# Patient Record
Sex: Male | Born: 1975 | Race: Black or African American | Hispanic: No | Marital: Single | Smoking: Current some day smoker
Health system: Southern US, Community
[De-identification: ages and names within clinical notes are randomized; demographics above are authoritative.]

## PROBLEM LIST (undated history)

## (undated) DIAGNOSIS — G8929 Other chronic pain: Secondary | ICD-10-CM

## (undated) DIAGNOSIS — R079 Chest pain, unspecified: Secondary | ICD-10-CM

## (undated) DIAGNOSIS — F319 Bipolar disorder, unspecified: Secondary | ICD-10-CM

## (undated) DIAGNOSIS — M5430 Sciatica, unspecified side: Secondary | ICD-10-CM

## (undated) DIAGNOSIS — I10 Essential (primary) hypertension: Secondary | ICD-10-CM

## (undated) DIAGNOSIS — G43909 Migraine, unspecified, not intractable, without status migrainosus: Secondary | ICD-10-CM

## (undated) DIAGNOSIS — Z720 Tobacco use: Secondary | ICD-10-CM

## (undated) DIAGNOSIS — R55 Syncope and collapse: Secondary | ICD-10-CM

## (undated) DIAGNOSIS — M549 Dorsalgia, unspecified: Secondary | ICD-10-CM

## (undated) HISTORY — DX: Syncope and collapse: R55

## (undated) HISTORY — DX: Essential (primary) hypertension: I10

## (undated) HISTORY — DX: Bipolar disorder, unspecified: F31.9

## (undated) HISTORY — DX: Tobacco use: Z72.0

## (undated) HISTORY — PX: OTHER SURGICAL HISTORY: SHX169

## (undated) HISTORY — DX: Chest pain, unspecified: R07.9

---

## 2002-07-06 ENCOUNTER — Emergency Department (HOSPITAL_COMMUNITY): Admission: EM | Admit: 2002-07-06 | Discharge: 2002-07-06 | Payer: Self-pay | Admitting: Internal Medicine

## 2002-07-06 ENCOUNTER — Encounter: Payer: Self-pay | Admitting: Internal Medicine

## 2004-06-17 ENCOUNTER — Emergency Department (HOSPITAL_COMMUNITY): Admission: EM | Admit: 2004-06-17 | Discharge: 2004-06-17 | Payer: Self-pay | Admitting: Emergency Medicine

## 2005-04-29 ENCOUNTER — Emergency Department (HOSPITAL_COMMUNITY): Admission: EM | Admit: 2005-04-29 | Discharge: 2005-04-29 | Payer: Self-pay | Admitting: Emergency Medicine

## 2007-11-19 ENCOUNTER — Emergency Department: Payer: Self-pay | Admitting: Emergency Medicine

## 2008-01-22 ENCOUNTER — Emergency Department (HOSPITAL_COMMUNITY): Admission: EM | Admit: 2008-01-22 | Discharge: 2008-01-22 | Payer: Self-pay | Admitting: Emergency Medicine

## 2008-05-21 ENCOUNTER — Emergency Department (HOSPITAL_COMMUNITY): Admission: EM | Admit: 2008-05-21 | Discharge: 2008-05-21 | Payer: Self-pay | Admitting: Emergency Medicine

## 2012-01-05 ENCOUNTER — Encounter (HOSPITAL_COMMUNITY): Payer: Self-pay | Admitting: Physical Medicine and Rehabilitation

## 2012-01-05 ENCOUNTER — Emergency Department (HOSPITAL_COMMUNITY)
Admission: EM | Admit: 2012-01-05 | Discharge: 2012-01-05 | Disposition: A | Discharge status: Home / Self Care | Payer: Self-pay | Attending: Emergency Medicine | Admitting: Emergency Medicine

## 2012-01-05 ENCOUNTER — Emergency Department (HOSPITAL_COMMUNITY): Payer: Self-pay

## 2012-01-05 DIAGNOSIS — S93409A Sprain of unspecified ligament of unspecified ankle, initial encounter: Secondary | ICD-10-CM | POA: Insufficient documentation

## 2012-01-05 DIAGNOSIS — X500XXA Overexertion from strenuous movement or load, initial encounter: Secondary | ICD-10-CM | POA: Insufficient documentation

## 2012-01-05 MED ORDER — IBUPROFEN 800 MG PO TABS
ORAL_TABLET | ORAL | Status: AC
  Filled 2012-01-05: qty 1

## 2012-01-05 MED ORDER — IBUPROFEN 800 MG PO TABS
800.0000 mg | ORAL_TABLET | Freq: Once | ORAL | Status: AC
  Administered 2012-01-05: 800 mg via ORAL

## 2012-01-05 NOTE — ED Notes (Signed)
Pt transported to xray 

## 2012-01-05 NOTE — ED Notes (Signed)
Pt returned to exam room. 

## 2012-01-05 NOTE — ED Provider Notes (Signed)
History     CSN: [id number]  Arrival date & time 01/05/12  4696   First MD Initiated Contact with Patient 01/05/12 [phone]      Chief Complaint  Patient presents with  . Ankle Pain    (Consider location/radiation/quality/duration/timing/severity/associated sxs/prior treatment) HPI  Patient presents to emergency department complaining of right ankle injury. Patient states he was running and felt his right ankle invert last night causing pain. Patient states that since onset pain as been gradually worsening with gradual worsening swelling. Patient states pain and swelling is located on the lateral aspect of his ankle. Patient states he's taken Tylenol for pain with mild relief. Patient states he woke this morning with ongoing pain and ongoing inability to bear weight and therefore presents to emergency department for evaluation. Patient states he has no known medical problems and takes no medicines on a regular basis. He has not seen an orthopedic specialist in [place] [facility] area in the past. Patient states pain is aggravated by movement and weight-bearing and improved with rest. He denies numbness or tingling and larger degree denies additional injury.  History reviewed. No pertinent past medical history.  History reviewed. No pertinent past surgical history.  History reviewed. No pertinent family history.  History  Substance Use Topics  . Smoking status: Current Some Day Smoker    Types: Cigarettes  . Smokeless tobacco: Not on file  . Alcohol Use: No      Review of Systems  Musculoskeletal: Positive for joint swelling. Negative for back pain.  Neurological: Negative for weakness and numbness.    Allergies  Review of patient's allergies indicates no known allergies.  Home Medications  No current outpatient prescriptions on file.  BP 131/91  Pulse 86  Temp(Src) 98.2 F (36.8 C) (Oral)  Resp 20  SpO2 96%  Physical Exam  Nursing note and vitals reviewed. Constitutional:  He is oriented to person, place, and time. He appears well-developed.  HENT:  Head: Normocephalic and atraumatic.  Eyes: Conjunctivae are normal.  Neck: Normal range of motion. Neck supple.  Cardiovascular: Normal rate.   Pulmonary/Chest: Effort normal.  Musculoskeletal: Normal range of motion. He exhibits edema and tenderness.       TTP of lateral right ankle with soft tissue swelling but no bruising. Decreased ROM due to pain but good cap refill, pedal pulse and wiggling toes well. No TTP of entire forefoot.   Neurological: He is alert and oriented to person, place, and time.  Skin: Skin is warm and dry. No rash noted. No erythema. No pallor.    ED Course  Procedures (including critical care time)  PO ibuprofen  Labs Reviewed - No data to display Dg Ankle Complete Right  01/05/2012  *RADIOLOGY REPORT*  Clinical Data: Fall, ankle injury, pain.  RIGHT ANKLE - COMPLETE 3+ VIEW  Comparison: None.  Findings: No acute bony abnormality.  Specifically, no fracture, subluxation, or dislocation.  Soft tissues are intact.  IMPRESSION: No acute bony abnormality.  Original Report Authenticated By: Cyndie Chime, M.D.     1. Ankle sprain       MDM  Swelling and TTP of right lateral ankle but no TTP or swelling of fore foot or calf. No break in skin. Good pedal pulse and cap refill of all toes. Wiggling toes without difficulty.  No acute findings on xray         Lenon Oms [place], [place] 01/05/12 [phone]

## 2012-01-05 NOTE — Discharge Instructions (Signed)
Wear ankle brace for at least 2 weeks for stabilization of ankle. Use crutches as needed for comfort. Ice and elevate ankle throughout the day. Alternate between ibuprofen and Tylenol for pain relief. You make take 800mg  of ibuprofen, three times a day with food on your stomach for maximum dose for the next 5 days. Call orthopedic follow up today today or tomorrow to schedule followup appointment for recheck of ongoing ankle pain that can be canceled with a 24-48 hour notice if complete resolution of pain.   Ankle Sprain An ankle sprain is an injury to the strong, fibrous tissues (ligaments) that hold the bones of your ankle joint together.  CAUSES Ankle sprain usually is caused by a fall or by twisting your ankle. People who participate in sports are more prone to these types of injuries.  SYMPTOMS  Symptoms of ankle sprain include:  Pain in your ankle. The pain may be present at rest or only when you are trying to stand or walk.   Swelling.   Bruising. Bruising may develop immediately or within 1 to 2 days after your injury.   Difficulty standing or walking.  DIAGNOSIS  Your caregiver will ask you details about your injury and perform a physical exam of your ankle to determine if you have an ankle sprain. During the physical exam, your caregiver will press and squeeze specific areas of your foot and ankle. Your caregiver will try to move your ankle in certain ways. An X-ray exam may be done to be sure a bone was not broken or a ligament did not separate from one of the bones in your ankle (avulsion).  TREATMENT  Certain types of braces can help stabilize your ankle. Your caregiver can make a recommendation for this. Your caregiver may recommend the use of medication for pain. If your sprain is severe, your caregiver may refer you to a surgeon who helps to restore function to parts of your skeletal system (orthopedist) or a physical therapist. HOME CARE INSTRUCTIONS  Apply ice to your injury  for 1 to 2 days or as directed by your caregiver. Applying ice helps to reduce inflammation and pain.  Put ice in a plastic bag.   Place a towel between your skin and the bag.   Leave the ice on for 15 to 20 minutes at a time, every 2 hours while you are awake.   Take over-the-counter or prescription medicines for pain, discomfort, or fever only as directed by your caregiver.   Keep your injured leg elevated, when possible, to lessen swelling.   If your caregiver recommends crutches, use them as instructed. Gradually, put weight on the affected ankle. Continue to use crutches or a cane until you can walk without feeling pain in your ankle.   If you have a plaster splint, wear the splint as directed by your caregiver. Do not rest it on anything harder than a pillow the first 24 hours. Do not put weight on it. Do not get it wet. You may take it off to take a shower or bath.   You may have been given an elastic bandage to wear around your ankle to provide support. If the elastic bandage is too tight (you have numbness or tingling in your foot or your foot becomes cold and blue), adjust the bandage to make it comfortable.   If you have an air splint, you may blow more air into it or let air out to make it more comfortable. You may take your  splint off at night and before taking a shower or bath.   Wiggle your toes in the splint several times per day if you are able.  SEEK MEDICAL CARE IF:   You have an increase in bruising, swelling, or pain.   Your toes feel cold.   Pain relief is not achieved with medication.  SEEK IMMEDIATE MEDICAL CARE IF: Your toes are numb or blue or you have severe pain. MAKE SURE YOU:   Understand these instructions.   Will watch your condition.   Will get help right away if you are not doing well or get worse.  Document Released: 09/06/2005 Document Revised: 08/26/2011 Document Reviewed: 04/10/2008 [facility] Patient Information 2012 [place], [place].

## 2012-01-05 NOTE — ED Notes (Signed)
Pt presents to department for evaluation of R ankle pain. States he injured ankle playing basketball last night. Now states pain and swelling. 7/10 pain upon arrival to ED. Ambulatory to triage. No signs of distress noted at the time.

## 2012-01-05 NOTE — Progress Notes (Signed)
Orthopedic Tech Progress Note Patient Details:  Maxwell White 02-28-1976 [id number]  Other Ortho Devices Type of Ortho Device: ASO;Crutches Ortho Device Location: right ankle Ortho Device Interventions: Application   Crickett Abbett T 01/05/2012, 9:54 AM

## 2012-01-06 NOTE — ED Provider Notes (Signed)
Medical screening examination/treatment/procedure(s) were performed by non-physician practitioner and as supervising physician I was immediately available for consultation/collaboration.   Joya Gaskins, MD 01/06/12 [phone]

## 2012-08-02 ENCOUNTER — Emergency Department (HOSPITAL_COMMUNITY)
Admission: EM | Admit: 2012-08-02 | Discharge: 2012-08-02 | Disposition: A | Discharge status: Home / Self Care | Payer: Self-pay | Attending: Emergency Medicine | Admitting: Emergency Medicine

## 2012-08-02 ENCOUNTER — Encounter (HOSPITAL_COMMUNITY): Payer: Self-pay | Admitting: *Deleted

## 2012-08-02 DIAGNOSIS — F17200 Nicotine dependence, unspecified, uncomplicated: Secondary | ICD-10-CM | POA: Insufficient documentation

## 2012-08-02 DIAGNOSIS — R109 Unspecified abdominal pain: Secondary | ICD-10-CM | POA: Insufficient documentation

## 2012-08-02 LAB — CBC WITH DIFFERENTIAL/PLATELET
Basophils Absolute: 0 10*3/uL (ref 0.0–0.1)
Basophils Relative: 0 % (ref 0–1)
HCT: 43.5 % (ref 39.0–52.0)
MCHC: 34.5 g/dL (ref 30.0–36.0)
Monocytes Absolute: 0.5 10*3/uL (ref 0.1–1.0)
Neutro Abs: 5.8 10*3/uL (ref 1.7–7.7)
Neutrophils Relative %: 64 % (ref 43–77)
Platelets: 240 10*3/uL (ref 150–400)
RDW: 12.9 % (ref 11.5–15.5)
WBC: 9.1 10*3/uL (ref 4.0–10.5)

## 2012-08-02 LAB — URINALYSIS, ROUTINE W REFLEX MICROSCOPIC
Bilirubin Urine: NEGATIVE
Glucose, UA: NEGATIVE mg/dL
Hgb urine dipstick: NEGATIVE
Protein, ur: NEGATIVE mg/dL
Specific Gravity, Urine: 1.02 (ref 1.005–1.030)

## 2012-08-02 LAB — COMPREHENSIVE METABOLIC PANEL
ALT: 15 U/L (ref 0–53)
AST: 14 U/L (ref 0–37)
Albumin: 3.8 g/dL (ref 3.5–5.2)
Chloride: 104 mEq/L (ref 96–112)
Creatinine, Ser: 1.06 mg/dL (ref 0.50–1.35)
Sodium: 141 mEq/L (ref 135–145)
Total Bilirubin: 0.6 mg/dL (ref 0.3–1.2)

## 2012-08-02 MED ORDER — IBUPROFEN 800 MG PO TABS: 800.0000 mg | ORAL_TABLET | Freq: Three times a day (TID) | ORAL | Status: DC | PRN

## 2012-08-02 NOTE — ED Provider Notes (Signed)
History     CSN: [id number]  Arrival date & time 08/02/12  1510   First MD Initiated Contact with Patient 08/02/12 1526      Chief Complaint  Patient presents with  . Flank Pain    (Consider location/radiation/quality/duration/timing/severity/associated sxs/prior treatment) Patient is a 36 y.o. male presenting with flank pain. The history is provided by the patient (the pt complains of left flank pain for a few weeks). No language interpreter was used.  Flank Pain This is a new problem. The current episode started more than 2 days ago. The problem occurs rarely. The problem has not changed since onset.Pertinent negatives include no chest pain, no abdominal pain and no headaches. Nothing aggravates the symptoms. Nothing relieves the symptoms. He has tried nothing for the symptoms. The treatment provided no relief.    History reviewed. No pertinent past medical history.  History reviewed. No pertinent past surgical history.  History reviewed. No pertinent family history.  History  Substance Use Topics  . Smoking status: Current Some Day Smoker    Types: Cigarettes  . Smokeless tobacco: Not on file  . Alcohol Use: Yes     Comment: occ      Review of Systems  Constitutional: Negative for fatigue.  HENT: Negative for congestion, sinus pressure and ear discharge.   Eyes: Negative for discharge.  Respiratory: Negative for cough.   Cardiovascular: Negative for chest pain.  Gastrointestinal: Negative for abdominal pain and diarrhea.  Genitourinary: Positive for flank pain. Negative for frequency and hematuria.  Musculoskeletal: Negative for back pain.  Skin: Negative for rash.  Neurological: Negative for seizures and headaches.  Hematological: Negative.   Psychiatric/Behavioral: Negative for hallucinations.    Allergies  Review of patient's allergies indicates no known allergies.  Home Medications   Current Outpatient Rx  Name  Route  Sig  Dispense  Refill  .  IBUPROFEN 800 MG PO TABS   Oral   Take 1 tablet (800 mg total) by mouth every 8 (eight) hours as needed for pain.   21 tablet   0     BP 133/91  Pulse 70  Temp 98.3 F (36.8 C) (Oral)  Resp 20  Ht 6\' 3"  (1.905 m)  Wt 250 lb (113.399 kg)  BMI 31.25 kg/m2  SpO2 100%  Physical Exam  Constitutional: He is oriented to person, place, and time. He appears well-developed.  HENT:  Head: Normocephalic and atraumatic.  Eyes: Conjunctivae normal and EOM are normal. No scleral icterus.  Neck: Neck supple. No thyromegaly present.  Cardiovascular: Normal rate and regular rhythm.  Exam reveals no gallop and no friction rub.   No murmur heard. Pulmonary/Chest: No stridor. He has no wheezes. He has no rales. He exhibits no tenderness.  Abdominal: He exhibits no distension. There is no tenderness. There is no rebound.  Genitourinary:       Mild left flank tenderness  Musculoskeletal: Normal range of motion. He exhibits no edema.  Lymphadenopathy:    He has no cervical adenopathy.  Neurological: He is oriented to person, place, and time. Coordination normal.  Skin: No rash noted. No erythema.  Psychiatric: He has a normal mood and affect. His behavior is normal.    ED Course  Procedures (including critical care time)  Labs Reviewed  COMPREHENSIVE METABOLIC PANEL - Abnormal; Notable for the following:    Glucose, Bld 109 (*)     All other components within normal limits  CBC WITH DIFFERENTIAL  URINALYSIS, ROUTINE W REFLEX MICROSCOPIC  No results found.   1. Flank pain       MDM          Benny Lennert, MD 08/02/12 1734

## 2012-08-02 NOTE — ED Notes (Signed)
Pain lt flank x 2 weeks, urine darker than usual. Dysuria at times.  No fever,

## 2013-04-06 ENCOUNTER — Encounter (HOSPITAL_COMMUNITY): Payer: Self-pay | Admitting: *Deleted

## 2013-04-06 ENCOUNTER — Emergency Department (HOSPITAL_COMMUNITY)
Admission: EM | Admit: 2013-04-06 | Discharge: 2013-04-06 | Disposition: A | Discharge status: Home / Self Care | Payer: Self-pay | Attending: Emergency Medicine | Admitting: Emergency Medicine

## 2013-04-06 ENCOUNTER — Emergency Department (HOSPITAL_COMMUNITY): Payer: Self-pay

## 2013-04-06 DIAGNOSIS — M79609 Pain in unspecified limb: Secondary | ICD-10-CM | POA: Insufficient documentation

## 2013-04-06 DIAGNOSIS — M5430 Sciatica, unspecified side: Secondary | ICD-10-CM | POA: Insufficient documentation

## 2013-04-06 DIAGNOSIS — G8929 Other chronic pain: Secondary | ICD-10-CM | POA: Insufficient documentation

## 2013-04-06 DIAGNOSIS — F17200 Nicotine dependence, unspecified, uncomplicated: Secondary | ICD-10-CM | POA: Insufficient documentation

## 2013-04-06 DIAGNOSIS — M549 Dorsalgia, unspecified: Secondary | ICD-10-CM | POA: Insufficient documentation

## 2013-04-06 HISTORY — DX: Sciatica, unspecified side: M54.30

## 2013-04-06 HISTORY — DX: Other chronic pain: G89.29

## 2013-04-06 HISTORY — DX: Dorsalgia, unspecified: M54.9

## 2013-04-06 MED ORDER — METHOCARBAMOL 500 MG PO TABS: 1000.0000 mg | ORAL_TABLET | Freq: Four times a day (QID) | ORAL | Status: DC | PRN

## 2013-04-06 MED ORDER — HYDROCODONE-ACETAMINOPHEN 5-325 MG PO TABS: ORAL_TABLET | ORAL | Status: DC

## 2013-04-06 MED ORDER — NAPROXEN 250 MG PO TABS: 250.0000 mg | ORAL_TABLET | Freq: Two times a day (BID) | ORAL | Status: DC

## 2013-04-06 MED ORDER — ONDANSETRON HCL 4 MG PO TABS: 4.0000 mg | ORAL_TABLET | Freq: Three times a day (TID) | ORAL | Status: DC | PRN

## 2013-04-06 NOTE — ED Notes (Signed)
Chronic L lumbar pain and sciatic pain for 2 weeks after riding water slide. L leg now more numb from calf upward.

## 2013-04-06 NOTE — ED Provider Notes (Signed)
History    CSN: [id number] Arrival date & time 04/06/13  1233  First MD Initiated Contact with Patient 04/06/13 1338     Chief Complaint  Patient presents with  . Back Pain  . Leg Pain    HPI Pt was seen at 1350.  Per pt, c/o gradual onset and persistence of constant acute flair of his chronic low back "pain" for the past 2 weeks. States the pain began after he was "playing at a water park" and "going down a water slide a lot." States he was "jostled around" and "thinks that's what aggravated it.  Denies any change in his usual chronic pain pattern, which radiates down his left leg.  Pain worsens with palpation of the area and body position changes. Denies incont/retention of bowel or bladder, no saddle anesthesia, no focal motor weakness, no tingling/numbness in extremities, no fevers, no injury, no abd pain.   The symptoms have been associated with no other complaints. The patient has a significant history of similar symptoms previously.     Past Medical History  Diagnosis Date  . Sciatic pain   . Chronic back pain    History reviewed. No pertinent past surgical history.  History  Substance Use Topics  . Smoking status: Current Some Day Smoker    Types: Cigarettes  . Smokeless tobacco: Not on file  . Alcohol Use: Yes     Comment: occ    Review of Systems ROS: Statement: All systems negative except as marked or noted in the HPI; Constitutional: Negative for fever and chills. ; ; Eyes: Negative for eye pain, redness and discharge. ; ; ENMT: Negative for ear pain, hoarseness, nasal congestion, sinus pressure and sore throat. ; ; Cardiovascular: Negative for chest pain, palpitations, diaphoresis, dyspnea and peripheral edema. ; ; Respiratory: Negative for cough, wheezing and stridor. ; ; Gastrointestinal: Negative for nausea, vomiting, diarrhea, abdominal pain, blood in stool, hematemesis, jaundice and rectal bleeding. . ; ; Genitourinary: Negative for dysuria, flank pain and  hematuria. ; ; Musculoskeletal: +LBP. Negative for neck pain. Negative for swelling and trauma.; ; Skin: Negative for pruritus, rash, abrasions, blisters, bruising and skin lesion.; ; Neuro: Negative for headache, lightheadedness and neck stiffness. Negative for weakness, altered level of consciousness , altered mental status, extremity weakness, paresthesias, involuntary movement, seizure and syncope.     Allergies  Review of patient's allergies indicates no known allergies.  Home Medications  No current outpatient prescriptions on file. BP 135/111  Pulse 60  Temp(Src) 98.3 F (36.8 C) (Oral)  Resp 16  Ht 6\' 3"  (1.905 m)  Wt 247 lb (112.038 kg)  BMI 30.87 kg/m2  SpO2 100% Physical Exam 1355: Physical examination:  Nursing notes reviewed; Vital signs and O2 SAT reviewed;  Constitutional: Well developed, Well nourished, Well hydrated, In no acute distress; Head:  Normocephalic, atraumatic; Eyes: EOMI, PERRL, No scleral icterus; ENMT: Mouth and pharynx normal, Mucous membranes moist; Neck: Supple, Full range of motion, No lymphadenopathy; Cardiovascular: Regular rate and rhythm, No murmur, rub, or gallop; Respiratory: Breath sounds clear & equal bilaterally, No rales, rhonchi, wheezes.  Speaking full sentences with ease, Normal respiratory effort/excursion; Chest: Nontender, Movement normal; Abdomen: Soft, Nontender, Nondistended, Normal bowel sounds; Genitourinary: No CVA tenderness; Spine:  No midline CS, TS, LS tenderness.  +TTP left lumbar paraspinal muscles;; Extremities: Pulses normal, No tenderness, No edema, No calf edema or asymmetry.; Neuro: AA&Ox3, Major CN grossly intact.  Speech clear. Climbs on and off stretcher to bedside chair by himself  without difficulty. Gait steady. Strength 5/5 equal bilat UE's and LE's, including great toe dorsiflexion.  DTR 2/4 equal bilat UE's and LE's.  No gross sensory deficits.  Neg straight leg raises bilat..; Skin: Color normal, Warm, Dry.    ED  Course  Procedures     MDM  MDM Reviewed: previous chart, nursing note and vitals Interpretation: x-ray   Dg Lumbar Spine Complete 04/06/2013   *RADIOLOGY REPORT*  Clinical Data: Low back pain and left leg pain.  LUMBAR SPINE - COMPLETE 4+ VIEW  Comparison: None.  Findings: Mild osteophytes are present at the L4-5 and L5-S1 levels.  There is also mild disc space narrowing at L5-S1. There is minimal retrolisthesis of L5 on S1.  No evidence of acute fracture or bony lesion.  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   Original Report Authenticated By: Irish Lack, M.D.      1430:  No acute fx. No red flags on neuro exam. Will tx symptomatically at this time. Dx and testing d/w pt.  Questions answered.  Verb understanding, agreeable to d/c home with outpt f/u.   Laray Anger, DO 04/09/13 [phone]

## 2013-04-06 NOTE — ED Notes (Signed)
Pt c/o lower back pain which radiates down left leg. Pt has hx of sciatica and states these symptoms are similar with past episodes. Pt states home meds are not helping the pain.

## 2013-07-25 ENCOUNTER — Encounter (HOSPITAL_COMMUNITY): Payer: Self-pay | Admitting: Emergency Medicine

## 2013-07-25 ENCOUNTER — Emergency Department (HOSPITAL_COMMUNITY)
Admission: EM | Admit: 2013-07-25 | Discharge: 2013-07-25 | Disposition: A | Discharge status: Home / Self Care | Payer: 59 | Attending: Emergency Medicine | Admitting: Emergency Medicine

## 2013-07-25 DIAGNOSIS — F17200 Nicotine dependence, unspecified, uncomplicated: Secondary | ICD-10-CM | POA: Insufficient documentation

## 2013-07-25 DIAGNOSIS — G8929 Other chronic pain: Secondary | ICD-10-CM | POA: Insufficient documentation

## 2013-07-25 DIAGNOSIS — Z79899 Other long term (current) drug therapy: Secondary | ICD-10-CM | POA: Insufficient documentation

## 2013-07-25 DIAGNOSIS — G43909 Migraine, unspecified, not intractable, without status migrainosus: Secondary | ICD-10-CM | POA: Insufficient documentation

## 2013-07-25 HISTORY — DX: Migraine, unspecified, not intractable, without status migrainosus: G43.909

## 2013-07-25 MED ORDER — SUMATRIPTAN SUCCINATE 50 MG PO TABS: 50.0000 mg | ORAL_TABLET | ORAL | Status: DC | PRN

## 2013-07-25 MED ORDER — DIPHENHYDRAMINE HCL 50 MG/ML IJ SOLN
50.0000 mg | Freq: Once | INTRAMUSCULAR | Status: AC
  Administered 2013-07-25: 50 mg via INTRAVENOUS
  Filled 2013-07-25: qty 1

## 2013-07-25 MED ORDER — PROCHLORPERAZINE EDISYLATE 5 MG/ML IJ SOLN
10.0000 mg | Freq: Once | INTRAMUSCULAR | Status: AC
  Administered 2013-07-25: 10 mg via INTRAVENOUS
  Filled 2013-07-25: qty 2

## 2013-07-25 MED ORDER — DEXAMETHASONE SODIUM PHOSPHATE 10 MG/ML IJ SOLN
10.0000 mg | Freq: Once | INTRAMUSCULAR | Status: AC
  Administered 2013-07-25: 10 mg via INTRAVENOUS
  Filled 2013-07-25: qty 1

## 2013-07-25 NOTE — ED Provider Notes (Signed)
Medical screening examination/treatment/procedure(s) were performed by non-physician practitioner and as supervising physician I was immediately available for consultation/collaboration.    Vida Roller, MD 07/25/13 [phone]

## 2013-07-25 NOTE — ED Notes (Signed)
Pt c/o headache intermittently x 2 weeks.  

## 2013-07-25 NOTE — ED Notes (Signed)
PA at bedside to disscuss disposition

## 2013-07-25 NOTE — ED Provider Notes (Signed)
CSN: [id number]     Arrival date & time 07/25/13  0008 History   First MD Initiated Contact with Patient 07/25/13 0013     Chief Complaint  Patient presents with  . Headache   (Consider location/radiation/quality/duration/timing/severity/associated sxs/prior Treatment) HPI Comments: Maxwell Lizarraga Riesgo is a 37 y.o. Male with an approximate 15 year history of chronic intermittent migraine headache ([place], presenting tonight with posterior headache which woke him from sleep tonight.  He has had increased headaches over the past 2 weeks,  Which is states is a typical pattern, as he will have headache free intervals than have multiple back to back headaches.  His headache is associated with mild photophobia,  Sometimes will have scotoma described as dark spots in his field of vision, but not at present.  He denies focal weakness, fevers, dizziness, speech difficulty, visual changes,  confusion, neck pain or stiffness.  The patient has taken ibuprofen  without relief of symptoms.  He used to be prescribed imitrex but has not taken this in several years.      The history is provided by the patient.    Past Medical History  Diagnosis Date  . Sciatic pain   . Chronic back pain   . Migraine    History reviewed. No pertinent past surgical history. History reviewed. No pertinent family history. History  Substance Use Topics  . Smoking status: Current Some Day Smoker    Types: Cigarettes  . Smokeless tobacco: Not on file  . Alcohol Use: Yes     Comment: occ    Review of Systems  Constitutional: Negative for fever.  HENT: Negative for congestion and sore throat.   Eyes: Negative.   Respiratory: Negative for chest tightness and shortness of breath.   Cardiovascular: Negative for chest pain.  Gastrointestinal: Negative for nausea and abdominal pain.  Genitourinary: Negative.   Musculoskeletal: Negative for arthralgias, joint swelling and neck pain.   Skin: Negative.  Negative for rash and wound.  Neurological: Positive for headaches. Negative for dizziness, speech difficulty, weakness, light-headedness and numbness.  Psychiatric/Behavioral: Negative.     Allergies  Review of patient's allergies indicates no known allergies.  Home Medications   Current Outpatient Rx  Name  Route  Sig  Dispense  Refill  . HYDROcodone-acetaminophen (NORCO/VICODIN) 5-325 MG per tablet      1 or 2 tabs PO q6 hours prn pain   20 tablet   0   . methocarbamol (ROBAXIN) 500 MG tablet   Oral   Take 2 tablets (1,000 mg total) by mouth 4 (four) times daily as needed (muscle spasm/pain).   25 tablet   0   . naproxen (NAPROSYN) 250 MG tablet   Oral   Take 1 tablet (250 mg total) by mouth 2 (two) times daily with a meal.   14 tablet   0   . ondansetron (ZOFRAN) 4 MG tablet   Oral   Take 1 tablet (4 mg total) by mouth every 8 (eight) hours as needed for nausea.   6 tablet   0   . SUMAtriptan (IMITREX) 50 MG tablet   Oral   Take 1 tablet (50 mg total) by mouth every 2 (two) hours as needed for migraine or headache. May repeat in 2 hours if headache persists or recurs.   10 tablet   0    BP 118/73  Pulse 53  Temp(Src) 98.3 F (36.8 C)  Resp 16  Ht 6\' 3"  (  1.905 m)  Wt 240 lb (108.863 kg)  BMI 30.00 kg/m2  SpO2 98% Physical Exam  Nursing note and vitals reviewed. Constitutional: He is oriented to person, place, and time. He appears well-developed and well-nourished. No distress.  HENT:  Head: Normocephalic and atraumatic.  Mouth/Throat: Oropharynx is clear and moist.  Eyes: EOM are normal. Pupils are equal, round, and reactive to light.  Neck: Normal range of motion. Neck supple.  Cardiovascular: Normal rate and normal heart sounds.   Pulmonary/Chest: Effort normal.  Abdominal: Soft. There is no tenderness.  Musculoskeletal: Normal range of motion.  Lymphadenopathy:    He has no cervical adenopathy.  Neurological: He is alert and  oriented to person, place, and time. He has normal strength. No sensory deficit. Gait normal. GCS eye subscore is 4. GCS verbal subscore is 5. GCS motor subscore is 6.  Normal heel-shin, normal rapid alternating movements. Cranial nerves III-XII intact.  No pronator drift.  Skin: Skin is warm and dry. No rash noted.  Psychiatric: He has a normal mood and affect. His speech is normal and behavior is normal. Thought content normal. Cognition and memory are normal.    ED Course  Procedures (including critical care time) Labs Review Labs Reviewed - No data to display Imaging Review No results found.  EKG Interpretation   None     Pt was given IV meds including decadron 10 mg, compazine 10 mg and benadryl 50 mg.   MDM   1. Migraine headache    Headache resolved after receiving compazine 10 mg, benadryl 50 mg , decadron 10 mg IV.    No neuro deficit on exam or by history to suggest emergent or surgical presentation.  Pt stable at dc.         Burgess Amor, PA-C 07/25/13 [phone]

## 2014-12-09 ENCOUNTER — Encounter (HOSPITAL_COMMUNITY): Payer: Self-pay | Admitting: *Deleted

## 2014-12-09 ENCOUNTER — Emergency Department (HOSPITAL_COMMUNITY)
Admission: EM | Admit: 2014-12-09 | Discharge: 2014-12-09 | Disposition: A | Discharge status: Home / Self Care | Payer: 59 | Attending: Emergency Medicine | Admitting: Emergency Medicine

## 2014-12-09 DIAGNOSIS — G8929 Other chronic pain: Secondary | ICD-10-CM | POA: Insufficient documentation

## 2014-12-09 DIAGNOSIS — G43909 Migraine, unspecified, not intractable, without status migrainosus: Secondary | ICD-10-CM | POA: Diagnosis not present

## 2014-12-09 DIAGNOSIS — Z791 Long term (current) use of non-steroidal anti-inflammatories (NSAID): Secondary | ICD-10-CM | POA: Insufficient documentation

## 2014-12-09 DIAGNOSIS — K649 Unspecified hemorrhoids: Secondary | ICD-10-CM | POA: Diagnosis not present

## 2014-12-09 DIAGNOSIS — K6289 Other specified diseases of anus and rectum: Secondary | ICD-10-CM | POA: Diagnosis present

## 2014-12-09 DIAGNOSIS — Z720 Tobacco use: Secondary | ICD-10-CM | POA: Diagnosis not present

## 2014-12-09 DIAGNOSIS — M5430 Sciatica, unspecified side: Secondary | ICD-10-CM | POA: Insufficient documentation

## 2014-12-09 NOTE — ED Provider Notes (Signed)
CSN: [id number]     Arrival date & time 12/09/14  2037 History  This chart was scribed for a non-physician practitioner, Noland Fordyce, PA-C working with Malvin Johns, MD by Martinique Peace, ED Scribe. The patient was seen in TR05C/TR05C. The patient's care was started at 9:12 PM.    Chief Complaint  Patient presents with  . Rectal Pain      The history is provided by the patient. No language interpreter was used.  HPI Comments: Maxwell White is a 39 y.o. male who presents to the Emergency Department complaining of chronic rectal pain and rectal bleeding with bowel movements. He also complains of constipation and diarrhea. Pt notes he has tried using Preparation H without any relief. He explains that he has been dealing with symptoms for past few years but adds that symptoms over the past few days have gotten progressively worse. Pt does not have PCP. History of hemorrhoids. Pt is current everyday smoker.   Past Medical History  Diagnosis Date  . Sciatic pain   . Chronic back pain   . Migraine    History reviewed. No pertinent past surgical history. History reviewed. No pertinent family history. History  Substance Use Topics  . Smoking status: Current Some Day Smoker    Types: Cigarettes  . Smokeless tobacco: Not on file  . Alcohol Use: Yes     Comment: occ    Review of Systems  Gastrointestinal: Positive for diarrhea, constipation, blood in stool and rectal pain.  All other systems reviewed and are negative.     Allergies  Review of patient's allergies indicates no known allergies.  Home Medications   Prior to Admission medications   Medication Sig Start Date End Date Taking? Authorizing Provider  HYDROcodone-acetaminophen (NORCO/VICODIN) 5-325 MG per tablet 1 or 2 tabs PO q6 hours prn pain 04/06/13   Francine Graven, DO  methocarbamol (ROBAXIN) 500 MG tablet Take 2 tablets (1,000 mg total) by mouth 4 (four) times daily as needed (muscle spasm/pain). 04/06/13   Francine Graven, DO  naproxen (NAPROSYN) 250 MG tablet Take 1 tablet (250 mg total) by mouth 2 (two) times daily with a meal. 04/06/13   Francine Graven, DO  ondansetron (ZOFRAN) 4 MG tablet Take 1 tablet (4 mg total) by mouth every 8 (eight) hours as needed for nausea. 04/06/13   Francine Graven, DO  SUMAtriptan (IMITREX) 50 MG tablet Take 1 tablet (50 mg total) by mouth every 2 (two) hours as needed for migraine or headache. May repeat in 2 hours if headache persists or recurs. 07/25/13   Evalee Jefferson, PA-C   BP 146/98 mmHg  Pulse 87  Temp(Src) 98.2 F (36.8 C) (Oral)  Resp 16  SpO2 99% Physical Exam  Constitutional: He is oriented to person, place, and time. He appears well-developed and well-nourished.  HENT:  Head: Normocephalic and atraumatic.  Eyes: EOM are normal.  Neck: Normal range of motion.  Cardiovascular: Normal rate.   Pulmonary/Chest: Effort normal.  Abdominal: Soft. There is no tenderness.  Genitourinary: Rectum normal.  Chaperon exam- normal sphincter tone. No large external hemorrhoids. No mass palpated. No rectal bleeding.   Musculoskeletal: Normal range of motion.  Neurological: He is alert and oriented to person, place, and time.  Skin: Skin is warm and dry.  Psychiatric: He has a normal mood and affect. His behavior is normal.  Nursing note and vitals reviewed.   ED Course  Procedures (including critical care time) Labs Review Labs Reviewed - No data to  display  Imaging Review No results found.   EKG Interpretation None     Medications - No data to display  9:16 PM- Treatment plan was discussed with patient who verbalizes understanding and agrees.   MDM   Final diagnoses:  Hemorrhoids, unspecified hemorrhoid type    Pt c/o hemorrhoids and having a small amount of red blood on tissue after bowel movements. No relief with preparation-H.  On exam, no hemorrhoid appreciated. No masses palpated. Normal sphincter tone. No rectal bleeding. Pt may have internal  hemorrhoids. Not concerned for perirectal abscess as pt is afebrile, non-tender on exam. No mass palpated. Will have pt f/u with [facility] to establish care with a PCP as well as refer to general surgery. Return precautions provided. Pt verbalized understanding and agreement with tx plan.   I personally performed the services described in this documentation, which was scribed in my presence. The recorded information has been reviewed and is accurate.    Noland Fordyce, PA-C 12/09/14 2125  Malvin Johns, MD 12/10/14 [phone]

## 2014-12-09 NOTE — ED Notes (Signed)
Pt reports hemorrhoid pain with bright red blood. Pt reports hx of the same for years.

## 2014-12-09 NOTE — Discharge Instructions (Signed)
Disposable Sitz Bath A disposable sitz bath is a plastic basin that fits over the toilet. A bag is hung above the toilet and is connected to a tube that opens into the disposable sitz bath. The bag is filled with warm water that can flow into the basin through the tube.  HOW TO USE A DISPOSABLE SITZ BATH  Close the clamp on the tubing before filling the bag with water. This is to prevent leakage.  Fill the sitz bath basin and the plastic bag with warm water.  Place the filled basin on the toilet with the seat raised. Make sure the overflow opening is facing toward the back of the toilet.  Hang the filled plastic bag overhead on a hook or towel rack close to the toilet. When the bag is unclamped, a steady stream of water will flow from the bag, through the tubing, and into the basin.  Attach the tubing to the opening on the basin.  Sit on the basin positioned on the toilet seat and release the clamp. This will allow warm water to flush the area around your genitals and anus (perineum).  Remain sitting on the basin for approximately 15 to 20 minutes.  Stand up and pat the perineum area dry. If needed, apply clean bandages (dressings) to the affected area.  Tip the basin into the toilet to remove any remaining water and flush the toilet.  Wash the basin with warm water and soap. Let it dry in the sink.  Store the basin and tubing in a clean, dry area.  Wash your hands with soap and water. SEEK MEDICAL CARE IF: You get worse instead of better. Stop the sitz baths if you get worse. MAKE SURE YOU:  Understand these instructions.  Will watch your condition.  Will get help right away if you are not doing well or get worse. Document Released: 03/07/2012 Document Revised: 05/31/2012 Document Reviewed: 03/07/2012 [facility] Patient Information 2015 [place], [place]. This information is not intended to replace advice given to you by your health care provider. Make sure you discuss any questions  you have with your health care provider.  Hemorrhoids Hemorrhoids are puffy (swollen) veins around the rectum or anus. Hemorrhoids can cause pain, itching, bleeding, or irritation. HOME CARE  Eat foods with fiber, such as whole grains, beans, nuts, fruits, and vegetables. Ask your doctor about taking products with added fiber in them (fibersupplements).  Drink enough fluid to keep your pee (urine) clear or pale yellow.  Exercise often.  Go to the bathroom when you have the urge to poop. Do not wait.  Avoid straining to poop (bowel movement).  Keep the butt area dry and clean. Use wet toilet paper or moist paper towels.  Medicated creams and medicine inserted into the anus (anal suppository) may be used or applied as told.  Only take medicine as told by your doctor.  Take a warm water bath (sitz bath) for 15-20 minutes to ease pain. Do this 3-4 times a day.  Place ice packs on the area if it is tender or puffy. Use the ice packs between the warm water baths.  Put ice in a plastic bag.  Place a towel between your skin and the bag.  Leave the ice on for 15-20 minutes, 03-04 times a day.  Do not use a donut-shaped pillow or sit on the toilet for a long time. GET HELP RIGHT AWAY IF:   You have more pain that is not controlled by treatment or medicine.  You have bleeding that will not stop.  You have trouble or are unable to poop (bowel movement).  You have pain or puffiness outside the area of the hemorrhoids. MAKE SURE YOU:   Understand these instructions.  Will watch your condition.  Will get help right away if you are not doing well or get worse. Document Released: 06/15/2008 Document Revised: 08/23/2012 Document Reviewed: 07/18/2012 [facility] Patient Information 2015 [place], [place]. This information is not intended to replace advice given to you by your health care provider. Make sure you discuss any questions you have with your health care  provider.  Hemorrhoidectomy Hemorrhoidectomy is surgery to remove hemorrhoids. Hemorrhoids are veins that have become swollen in the rectum. The rectum is the area from the bottom end of the intestines to the opening where bowel movements leave the body. Hemorrhoids can be uncomfortable. They can cause itching, bleeding and pain if a blood clot forms in them (thrombose). If hemorrhoids are small, surgery may not be needed. But if they cover a larger area, surgery is usually suggested.  LET YOUR CAREGIVER KNOW ABOUT:   Any allergies.  All medications you are taking, including:  Herbs, eyedrops, over-the-counter medications and creams.  Blood thinners (anticoagulants), aspirin or other drugs that could affect blood clotting.  Use of steroids (by mouth or as creams).  Previous problems with anesthetics, including local anesthetics.  Possibility of pregnancy, if this applies.  Any history of blood clots.  Any history of bleeding or other blood problems.  Previous surgery.  Smoking history.  Other health problems. RISKS AND COMPLICATIONS All surgery carries some risk. However, hemorrhoid surgery usually goes smoothly. Possible complications could include:  Urinary retention.  Bleeding.  Infection.  A painful incision.  A reaction to the anesthesia (this is not common). BEFORE THE PROCEDURE   Stop using aspirin and non-steroidal anti-inflammatory drugs (NSAIDs) for pain relief. This includes prescription drugs and over-the-counter drugs such as ibuprofen and naproxen. Also stop taking vitamin E. If possible, do this two weeks before your surgery.  If you take blood-thinners, ask your healthcare provider when you should stop taking them.  You will probably have blood and urine tests done several days before your surgery.  Do not eat or drink for about 8 hours before the surgery.  Arrive at least an hour before the surgery, or whenever your surgeon recommends. This will  give you time to check in and fill out any needed paperwork.  Hemorrhoidectomy is often an outpatient procedure. This means you will be able to go home the same day. Sometimes, though, people stay overnight in the hospital after the procedure. Ask your surgeon what to expect. Either way, make arrangements in advance for someone to drive you home. PROCEDURE   The preparation:  You will change into a hospital gown.  You will be given an IV. A needle will be inserted in your arm. Medication can flow directly into your body through this needle.  You might be given an enema to clear your rectum.  Once in the operating room, you will probably lie on your side or be repositioned later to lying on your stomach.  You will be given anesthesia (medication) so you will not feel anything during the surgery. The surgery often is done with local anesthesia (the area near the hemorrhoids will be numb and you will be drowsy but awake). Sometimes, general anesthesia is used (you will be asleep during the procedure).  The procedure:  There are a few different procedures  for hemorrhoids. Be sure to ask you surgeon about the procedure, the risks and benefits.  Be sure to ask about what you need to do to take care of the wound, if there is one. AFTER THE PROCEDURE  You will stay in a recovery area until the anesthesia has worn off. Your blood pressure and pulse will be checked every so often.  You may feel a lot of pain in the area of the rectum.  Take all pain medication prescribed by your surgeon. Ask before taking any over-the-counter pain medicines.  Sometimes sitting in a warm bath can help relieve your pain.  To make sure you have bowel movements without straining:  You will probably need to take stool softeners (usually a pill) for a few days.  You should drink 8 to 10 glasses of water each day.  Your activity will be restricted for awhile. Ask your caregiver for a list of what you should and  should not do while you recover. Document Released: 07/04/2009 Document Revised: 11/29/2011 Document Reviewed: 07/04/2009 [facility] Patient Information 2015 [place], [place]. This information is not intended to replace advice given to you by your health care provider. Make sure you discuss any questions you have with your health care provider.

## 2014-12-09 NOTE — ED Notes (Signed)
Pt made aware to return if symptoms worsen or if any life threatening symptoms occur.   

## 2014-12-18 ENCOUNTER — Ambulatory Visit: Payer: 59 | Attending: Internal Medicine | Admitting: Internal Medicine

## 2014-12-18 ENCOUNTER — Encounter: Payer: Self-pay | Admitting: Internal Medicine

## 2014-12-18 VITALS — BP 134/95 | HR 79 | Temp 98.0°F | Resp 16 | Wt 239.2 lb

## 2014-12-18 DIAGNOSIS — F329 Major depressive disorder, single episode, unspecified: Secondary | ICD-10-CM | POA: Diagnosis not present

## 2014-12-18 DIAGNOSIS — Z720 Tobacco use: Secondary | ICD-10-CM | POA: Diagnosis not present

## 2014-12-18 DIAGNOSIS — F32A Depression, unspecified: Secondary | ICD-10-CM

## 2014-12-18 DIAGNOSIS — Z139 Encounter for screening, unspecified: Secondary | ICD-10-CM | POA: Insufficient documentation

## 2014-12-18 DIAGNOSIS — K649 Unspecified hemorrhoids: Secondary | ICD-10-CM | POA: Diagnosis not present

## 2014-12-18 DIAGNOSIS — R030 Elevated blood-pressure reading, without diagnosis of hypertension: Secondary | ICD-10-CM | POA: Diagnosis not present

## 2014-12-18 DIAGNOSIS — IMO0001 Reserved for inherently not codable concepts without codable children: Secondary | ICD-10-CM

## 2014-12-18 DIAGNOSIS — F319 Bipolar disorder, unspecified: Secondary | ICD-10-CM | POA: Insufficient documentation

## 2014-12-18 DIAGNOSIS — I10 Essential (primary) hypertension: Secondary | ICD-10-CM | POA: Insufficient documentation

## 2014-12-18 DIAGNOSIS — F17200 Nicotine dependence, unspecified, uncomplicated: Secondary | ICD-10-CM

## 2014-12-18 LAB — COMPLETE METABOLIC PANEL WITH GFR
ALBUMIN: 4.5 g/dL (ref 3.5–5.2)
ALT: 14 U/L (ref 0–53)
AST: 17 U/L (ref 0–37)
Alkaline Phosphatase: 50 U/L (ref 39–117)
BUN: 14 mg/dL (ref 6–23)
CALCIUM: 9.4 mg/dL (ref 8.4–10.5)
CHLORIDE: 107 meq/L (ref 96–112)
CO2: 28 meq/L (ref 19–32)
Creat: 1.18 mg/dL (ref 0.50–1.35)
GFR, EST NON AFRICAN AMERICAN: 79 mL/min
GLUCOSE: 75 mg/dL (ref 70–99)
POTASSIUM: 4.9 meq/L (ref 3.5–5.3)
SODIUM: 141 meq/L (ref 135–145)
TOTAL PROTEIN: 7.2 g/dL (ref 6.0–8.3)
Total Bilirubin: 0.5 mg/dL (ref 0.2–1.2)

## 2014-12-18 LAB — CBC WITH DIFFERENTIAL/PLATELET
BASOS ABS: 0 10*3/uL (ref 0.0–0.1)
BASOS PCT: 0 % (ref 0–1)
EOS ABS: 0 10*3/uL (ref 0.0–0.7)
Eosinophils Relative: 0 % (ref 0–5)
HCT: 43.6 % (ref 39.0–52.0)
Hemoglobin: 15.2 g/dL (ref 13.0–17.0)
Lymphocytes Relative: 22 % (ref 12–46)
Lymphs Abs: 2.9 10*3/uL (ref 0.7–4.0)
MCH: 31.3 pg (ref 26.0–34.0)
MCHC: 34.9 g/dL (ref 30.0–36.0)
MCV: 89.7 fL (ref 78.0–100.0)
MONO ABS: 0.9 10*3/uL (ref 0.1–1.0)
MPV: 7.9 fL — ABNORMAL LOW (ref 8.6–12.4)
Monocytes Relative: 7 % (ref 3–12)
NEUTROS ABS: 9.5 10*3/uL — AB (ref 1.7–7.7)
NEUTROS PCT: 71 % (ref 43–77)
PLATELETS: 283 10*3/uL (ref 150–400)
RBC: 4.86 MIL/uL (ref 4.22–5.81)
RDW: 13.8 % (ref 11.5–15.5)
WBC: 13.4 10*3/uL — ABNORMAL HIGH (ref 4.0–10.5)

## 2014-12-18 LAB — TSH: TSH: 0.431 u[IU]/mL (ref 0.350–4.500)

## 2014-12-18 MED ORDER — BUPROPION HCL ER (SR) 100 MG PO TB12: 100.0000 mg | ORAL_TABLET | Freq: Two times a day (BID) | ORAL | Status: DC

## 2014-12-18 MED ORDER — HYDROCORTISONE ACETATE 25 MG RE SUPP: 25.0000 mg | Freq: Two times a day (BID) | RECTAL | Status: DC

## 2014-12-18 NOTE — Progress Notes (Signed)
Establish care Complains of having hemmrhoids that have been bothering him Also complains of having a lump to the left side of his fore head Currently not taking any prescribed medications

## 2014-12-18 NOTE — Progress Notes (Signed)
Patient Demographics  Maxwell White, is a 39 y.o. male  VOZ:[id number]  QQV:[id number]  DOB - 1976-06-13  CC:  Chief Complaint  Patient presents with  . Establish Care       HPI: Maxwell White is a 39 y.o. male here today to establish medical care.Patient recently went to the emergency room with symptoms of rectal pain and bleeding, EMR reviewed patient has possible history of internal hemorrhoids, patient has tried over-the-counter preparation H. without much improvement, as per patient this problem has been long-standing for several years and sometimes he has noticed the hemorrhoid comes out and he has to push it back, currently denies much bleeding, is requesting referral to see a Psychologist, sport and exercise. Patient also reported to have history of hypertension in the past as well as depression today's blood pressure is borderline elevated, patient also smokes cigarettes, I counseled patient to quit smoking, he would like to try some medication. Patient has No headache, No chest pain, No abdominal pain - No Nausea, No new weakness tingling or numbness, No Cough - SOB.  No Known Allergies Past Medical History  Diagnosis Date  . Sciatic pain   . Chronic back pain   . Migraine    Current Outpatient Prescriptions on File Prior to Visit  Medication Sig Dispense Refill  . HYDROcodone-acetaminophen (NORCO/VICODIN) 5-325 MG per tablet 1 or 2 tabs PO q6 hours prn pain 20 tablet 0  . methocarbamol (ROBAXIN) 500 MG tablet Take 2 tablets (1,000 mg total) by mouth 4 (four) times daily as needed (muscle spasm/pain). 25 tablet 0  . naproxen (NAPROSYN) 250 MG tablet Take 1 tablet (250 mg total) by mouth 2 (two) times daily with a meal. 14 tablet 0  . ondansetron (ZOFRAN) 4 MG tablet Take 1 tablet (4 mg total) by mouth every 8 (eight) hours as needed for nausea. 6 tablet 0  . SUMAtriptan (IMITREX) 50 MG tablet Take 1 tablet (50 mg total) by mouth every 2 (two) hours as needed for migraine or headache. May  repeat in 2 hours if headache persists or recurs. 10 tablet 0   No current facility-administered medications on file prior to visit.   Family History  Problem Relation Age of Onset  . Hypertension Mother   . Heart disease Mother   . Stroke Mother   . Diabetes Maternal Aunt   . Stroke Maternal Aunt   . Cancer Maternal Uncle   . Diabetes Maternal Grandmother   . Cancer Maternal Grandmother   . Diabetes Maternal Grandfather   . Cancer Paternal Grandfather    History   Social History  . Marital Status: Single    Spouse Name: N/A  . Number of Children: N/A  . Years of Education: N/A   Occupational History  . Not on file.   Social History Main Topics  . Smoking status: Current Some Day Smoker -- 0.50 packs/day for 4 years    Types: Cigarettes  . Smokeless tobacco: Not on file  . Alcohol Use: Yes     Comment: occ  . Drug Use: No  . Sexual Activity: Not on file   Other Topics Concern  . Not on file   Social History Narrative    Review of Systems: Constitutional: Negative for fever, chills, diaphoresis, activity change, appetite change and fatigue. HENT: Negative for ear pain, nosebleeds, congestion, facial swelling, rhinorrhea, neck pain, neck stiffness and ear discharge.  Eyes: Negative for pain, discharge, redness, itching and visual disturbance. Respiratory: Negative for cough, choking, chest  tightness, shortness of breath, wheezing and stridor.  Cardiovascular: Negative for chest pain, palpitations and leg swelling. Gastrointestinal: Negative for abdominal distention. Genitourinary: Negative for dysuria, urgency, frequency, hematuria, flank pain, decreased urine volume, difficulty urinating and dyspareunia.  Musculoskeletal: Negative for back pain, joint swelling, arthralgia and gait problem. Neurological: Negative for dizziness, tremors, seizures, syncope, facial asymmetry, speech difficulty, weakness, light-headedness, numbness and headaches.  Hematological:  Negative for adenopathy. Does not bruise/bleed easily. Psychiatric/Behavioral: Negative for hallucinations, behavioral problems, confusion, dysphoric mood, decreased concentration and agitation.    Objective:   Filed Vitals:   12/18/14 1424  BP: 134/95  Pulse: 79  Temp: 98 F (36.7 C)  Resp: 16    Physical Exam: Constitutional: Patient appears well-developed and well-nourished. No distress. HENT: Normocephalic, atraumatic, External right and left ear normal. Oropharynx is clear and moist.  Eyes: Conjunctivae and EOM are normal. PERRLA, no scleral icterus. Neck: Normal ROM. Neck supple. No JVD. No tracheal deviation. No thyromegaly. CVS: RRR, S1/S2 +, no murmurs, no gallops, no carotid bruit.  Pulmonary: Effort and breath sounds normal, no stridor, rhonchi, wheezes, rales.  Abdominal: Soft. BS +, no distension, tenderness, rebound or guarding.digital rectal examination done apparently no bleeding noticed no external hemorrhoids.  Musculoskeletal: Normal range of motion. No edema and no tenderness.  Neuro: Alert. Normal reflexes, muscle tone coordination. No cranial nerve deficit. Skin: Skin is warm and dry. No rash noted. Not diaphoretic. No erythema. No pallor. Psychiatric: Normal mood and affect. Behavior, judgment, thought content normal.  Lab Results  Component Value Date   WBC 9.1 08/02/2012   HGB 15.0 08/02/2012   HCT 43.5 08/02/2012   MCV 89.3 08/02/2012   PLT 240 08/02/2012   Lab Results  Component Value Date   CREATININE 1.06 08/02/2012   BUN 10 08/02/2012   NA 141 08/02/2012   K 3.5 08/02/2012   CL 104 08/02/2012   CO2 27 08/02/2012    No results found for: HGBA1C Lipid Panel  No results found for: CHOL, TRIG, HDL, CHOLHDL, VLDL, LDLCALC     Assessment and plan:   1. Hemorrhoids, unspecified hemorrhoid type  - Ambulatory referral to General Surgery - hydrocortisone (ANUSOL-HC) 25 MG suppository; Place 1 suppository (25 mg total) rectally 2 (two) times  daily.  Dispense: 12 suppository; Refill: 0  2. Smoking  - buPROPion (WELLBUTRIN SR) 100 MG 12 hr tablet; Take 1 tablet (100 mg total) by mouth 2 (two) times daily.  Dispense: 60 tablet; Refill: 3  3. Elevated BP Advised patient for DASH diet.  4. Depression  - buPROPion (WELLBUTRIN SR) 100 MG 12 hr tablet; Take 1 tablet (100 mg total) by mouth 2 (two) times daily.  Dispense: 60 tablet; Refill: 3  5. Screening Ordered baseline blood work  - CBC with Differential/Platelet - COMPLETE METABOLIC PANEL WITH GFR - Vit D  25 hydroxy (rtn osteoporosis monitoring) - Hemoglobin A1c - TSH       Return in about 3 months (around 03/20/2015), or if symptoms worsen or fail to improve.    The patient was given clear instructions to go to ER or return to medical center if symptoms don't improve, worsen or new problems develop. The patient verbalized understanding. The patient was told to call to get lab results if they haven't heard anything in the next week.    This note has been created with Surveyor, quantity. Any transcriptional errors are unintentional.   Lorayne Marek, MD

## 2014-12-18 NOTE — Patient Instructions (Addendum)
Smoking Cessation Quitting smoking is important to your health and has many advantages. However, it is not always easy to quit since nicotine is a very addictive drug. Oftentimes, people try 3 times or more before being able to quit. This document explains the best ways for you to prepare to quit smoking. Quitting takes hard work and a lot of effort, but you can do it. ADVANTAGES OF QUITTING SMOKING  You will live longer, feel better, and live better.  Your body will feel the impact of quitting smoking almost immediately.  Within 20 minutes, blood pressure decreases. Your pulse returns to its normal level.  After 8 hours, carbon monoxide levels in the blood return to normal. Your oxygen level increases.  After 24 hours, the chance of having a heart attack starts to decrease. Your breath, hair, and body stop smelling like smoke.  After 48 hours, damaged nerve endings begin to recover. Your sense of taste and smell improve.  After 72 hours, the body is virtually free of nicotine. Your bronchial tubes relax and breathing becomes easier.  After 2 to 12 weeks, lungs can hold more air. Exercise becomes easier and circulation improves.  The risk of having a heart attack, stroke, cancer, or lung disease is greatly reduced.  After 1 year, the risk of coronary heart disease is cut in half.  After 5 years, the risk of stroke falls to the same as a nonsmoker.  After 10 years, the risk of lung cancer is cut in half and the risk of other cancers decreases significantly.  After 15 years, the risk of coronary heart disease drops, usually to the level of a nonsmoker.  If you are pregnant, quitting smoking will improve your chances of having a healthy baby.  The people you live with, especially any children, will be healthier.  You will have extra money to spend on things other than cigarettes. QUESTIONS TO THINK ABOUT BEFORE ATTEMPTING TO QUIT You may want to talk about your answers with your  health care provider.  Why do you want to quit?  If you tried to quit in the past, what helped and what did not?  What will be the most difficult situations for you after you quit? How will you plan to handle them?  Who can help you through the tough times? Your family? Friends? A health care provider?  What pleasures do you get from smoking? What ways can you still get pleasure if you quit? Here are some questions to ask your health care provider:  How can you help me to be successful at quitting?  What medicine do you think would be best for me and how should I take it?  What should I do if I need more help?  What is smoking withdrawal like? How can I get information on withdrawal? GET READY  Set a quit date.  Change your environment by getting rid of all cigarettes, ashtrays, matches, and lighters in your home, car, or work. Do not let people smoke in your home.  Review your past attempts to quit. Think about what worked and what did not. GET SUPPORT AND ENCOURAGEMENT You have a better chance of being successful if you have help. You can get support in many ways.  Tell your family, friends, and coworkers that you are going to quit and need their support. Ask them not to smoke around you.  Get individual, group, or telephone counseling and support. Programs are available at local hospitals and health centers. Call   your local health department for information about programs in your area.  Spiritual beliefs and practices may help some smokers quit.  Download a "quit meter" on your computer to keep track of quit statistics, such as how long you have gone without smoking, cigarettes not smoked, and money saved.  Get a self-help book about quitting smoking and staying off tobacco. LEARN NEW SKILLS AND BEHAVIORS  Distract yourself from urges to smoke. Talk to someone, go for a walk, or occupy your time with a task.  Change your normal routine. Take a different route to work.  Drink tea instead of coffee. Eat breakfast in a different place.  Reduce your stress. Take a hot bath, exercise, or read a book.  Plan something enjoyable to do every day. Reward yourself for not smoking.  Explore interactive web-based programs that specialize in helping you quit. GET MEDICINE AND USE IT CORRECTLY Medicines can help you stop smoking and decrease the urge to smoke. Combining medicine with the above behavioral methods and support can greatly increase your chances of successfully quitting smoking.  Nicotine replacement therapy helps deliver nicotine to your body without the negative effects and risks of smoking. Nicotine replacement therapy includes nicotine gum, lozenges, inhalers, nasal sprays, and skin patches. Some may be available over-the-counter and others require a prescription.  Antidepressant medicine helps people abstain from smoking, but how this works is unknown. This medicine is available by prescription.  Nicotinic receptor partial agonist medicine simulates the effect of nicotine in your brain. This medicine is available by prescription. Ask your health care provider for advice about which medicines to use and how to use them based on your health history. Your health care provider will tell you what side effects to look out for if you choose to be on a medicine or therapy. Carefully read the information on the package. Do not use any other product containing nicotine while using a nicotine replacement product.  RELAPSE OR DIFFICULT SITUATIONS Most relapses occur within the first 3 months after quitting. Do not be discouraged if you start smoking again. Remember, most people try several times before finally quitting. You may have symptoms of withdrawal because your body is used to nicotine. You may crave cigarettes, be irritable, feel very hungry, cough often, get headaches, or have difficulty concentrating. The withdrawal symptoms are only temporary. They are strongest  when you first quit, but they will go away within 10-14 days. To reduce the chances of relapse, try to:  Avoid drinking alcohol. Drinking lowers your chances of successfully quitting.  Reduce the amount of caffeine you consume. Once you quit smoking, the amount of caffeine in your body increases and can give you symptoms, such as a rapid heartbeat, sweating, and anxiety.  Avoid smokers because they can make you want to smoke.  Do not let weight gain distract you. Many smokers will gain weight when they quit, usually less than 10 pounds. Eat a healthy diet and stay active. You can always lose the weight gained after you quit.  Find ways to improve your mood other than smoking. FOR MORE INFORMATION  www.smokefree.gov  Document Released: 08/31/2001 Document Revised: 01/21/2014 Document Reviewed: 12/16/2011 [employer] Patient Information 2015 [employer]. This information is not intended to replace advice given to you by your health care provider. Make sure you discuss any questions you have with your health care provider. DASH Eating Plan DASH stands for "Dietary Approaches to Stop Hypertension." The DASH eating plan is a healthy eating plan that has   been shown to reduce high blood pressure (hypertension). Additional health benefits may include reducing the risk of type 2 diabetes mellitus, heart disease, and stroke. The DASH eating plan may also help with weight loss. WHAT DO I NEED TO KNOW ABOUT THE DASH EATING PLAN? For the DASH eating plan, you will follow these general guidelines:  Choose foods with a percent daily value for sodium of less than 5% (as listed on the food label).  Use salt-free seasonings or herbs instead of table salt or sea salt.  Check with your health care provider or pharmacist before using salt substitutes.  Eat lower-sodium products, often labeled as "lower sodium" or "no salt added."  Eat fresh foods.  Eat more vegetables, fruits, and low-fat dairy  products.  Choose whole grains. Look for the word "whole" as the first word in the ingredient list.  Choose fish and skinless chicken or turkey more often than red meat. Limit fish, poultry, and meat to 6 oz (170 g) each day.  Limit sweets, desserts, sugars, and sugary drinks.  Choose heart-healthy fats.  Limit cheese to 1 oz (28 g) per day.  Eat more home-cooked food and less restaurant, buffet, and fast food.  Limit fried foods.  Cook foods using methods other than frying.  Limit canned vegetables. If you do use them, rinse them well to decrease the sodium.  When eating at a restaurant, ask that your food be prepared with less salt, or no salt if possible. WHAT FOODS CAN I EAT? Seek help from a dietitian for individual calorie needs. Grains Whole grain or whole wheat bread. Brown rice. Whole grain or whole wheat pasta. Quinoa, bulgur, and whole grain cereals. Low-sodium cereals. Corn or whole wheat flour tortillas. Whole grain cornbread. Whole grain crackers. Low-sodium crackers. Vegetables Fresh or frozen vegetables (raw, steamed, roasted, or grilled). Low-sodium or reduced-sodium tomato and vegetable juices. Low-sodium or reduced-sodium tomato sauce and paste. Low-sodium or reduced-sodium canned vegetables.  Fruits All fresh, canned (in natural juice), or frozen fruits. Meat and Other Protein Products Ground beef (85% or leaner), grass-fed beef, or beef trimmed of fat. Skinless chicken or turkey. Ground chicken or turkey. Pork trimmed of fat. All fish and seafood. Eggs. Dried beans, peas, or lentils. Unsalted nuts and seeds. Unsalted canned beans. Dairy Low-fat dairy products, such as skim or 1% milk, 2% or reduced-fat cheeses, low-fat ricotta or cottage cheese, or plain low-fat yogurt. Low-sodium or reduced-sodium cheeses. Fats and Oils Tub margarines without trans fats. Light or reduced-fat mayonnaise and salad dressings (reduced sodium). Avocado. Safflower, olive, or canola  oils. Natural peanut or almond butter. Other Unsalted popcorn and pretzels. The items listed above may not be a complete list of recommended foods or beverages. Contact your dietitian for more options. WHAT FOODS ARE NOT RECOMMENDED? Grains White bread. White pasta. White rice. Refined cornbread. Bagels and croissants. Crackers that contain trans fat. Vegetables Creamed or fried vegetables. Vegetables in a cheese sauce. Regular canned vegetables. Regular canned tomato sauce and paste. Regular tomato and vegetable juices. Fruits Dried fruits. Canned fruit in light or heavy syrup. Fruit juice. Meat and Other Protein Products Fatty cuts of meat. Ribs, chicken wings, bacon, sausage, bologna, salami, chitterlings, fatback, hot dogs, bratwurst, and packaged luncheon meats. Salted nuts and seeds. Canned beans with salt. Dairy Whole or 2% milk, cream, half-and-half, and cream cheese. Whole-fat or sweetened yogurt. Full-fat cheeses or blue cheese. Nondairy creamers and whipped toppings. Processed cheese, cheese spreads, or cheese curds. Condiments Onion and garlic salt,   seasoned salt, table salt, and sea salt. Canned and packaged gravies. Worcestershire sauce. Tartar sauce. Barbecue sauce. Teriyaki sauce. Soy sauce, including reduced sodium. Steak sauce. Fish sauce. Oyster sauce. Cocktail sauce. Horseradish. Ketchup and mustard. Meat flavorings and tenderizers. Bouillon cubes. Hot sauce. Tabasco sauce. Marinades. Taco seasonings. Relishes. Fats and Oils Butter, stick margarine, lard, shortening, ghee, and bacon fat. Coconut, palm kernel, or palm oils. Regular salad dressings. Other Pickles and olives. Salted popcorn and pretzels. The items listed above may not be a complete list of foods and beverages to avoid. Contact your dietitian for more information. WHERE CAN I FIND MORE INFORMATION? National Heart, Lung, and Blood Institute: travelstabloid.com Document Released:  08/26/2011 Document Revised: 01/21/2014 Document Reviewed: 07/11/2013 [facility] Patient Information 2015 [place], [place]. This information is not intended to replace advice given to you by your health care provider. Make sure you discuss any questions you have with your health care provider. High-Fiber Diet Fiber is found in fruits, vegetables, and grains. A high-fiber diet encourages the addition of more whole grains, legumes, fruits, and vegetables in your diet. The recommended amount of fiber for adult males is 38 g per day. For adult females, it is 25 g per day. Pregnant and lactating women should get 28 g of fiber per day. If you have a digestive or bowel problem, ask your caregiver for advice before adding high-fiber foods to your diet. Eat a variety of high-fiber foods instead of only a select few type of foods.  PURPOSE  To increase stool bulk.  To make bowel movements more regular to prevent constipation.  To lower cholesterol.  To prevent overeating. WHEN IS THIS DIET USED?  It may be used if you have constipation and hemorrhoids.  It may be used if you have uncomplicated diverticulosis (intestine condition) and irritable bowel syndrome.  It may be used if you need help with weight management.  It may be used if you want to add it to your diet as a protective measure against atherosclerosis, diabetes, and cancer. SOURCES OF FIBER  Whole-grain breads and cereals.  Fruits, such as apples, oranges, bananas, berries, prunes, and pears.  Vegetables, such as green peas, carrots, sweet potatoes, beets, broccoli, cabbage, spinach, and artichokes.  Legumes, such split peas, soy, lentils.  Almonds. FIBER CONTENT IN FOODS Starches and Grains / Dietary Fiber (g)  Cheerios, 1 cup / 3 g  Corn Flakes cereal, 1 cup / 0.7 g  Rice crispy treat cereal, 1 cup / 0.3 g  Instant oatmeal (cooked),  cup / 2 g  Frosted wheat cereal, 1 cup / 5.1 g  Brown, long-grain rice (cooked), 1 cup  / 3.5 g  White, long-grain rice (cooked), 1 cup / 0.6 g  Enriched macaroni (cooked), 1 cup / 2.5 g Legumes / Dietary Fiber (g)  Baked beans (canned, plain, or vegetarian),  cup / 5.2 g  Kidney beans (canned),  cup / 6.8 g  Pinto beans (cooked),  cup / 5.5 g Breads and Crackers / Dietary Fiber (g)  Plain or honey graham crackers, 2 squares / 0.7 g  Saltine crackers, 3 squares / 0.3 g  Plain, salted pretzels, 10 pieces / 1.8 g  Whole-wheat bread, 1 slice / 1.9 g  White bread, 1 slice / 0.7 g  Raisin bread, 1 slice / 1.2 g  Plain bagel, 3 oz / 2 g  Flour tortilla, 1 oz / 0.9 g  Corn tortilla, 1 small / 1.5 g  Hamburger or hotdog bun, 1 small /  0.9 g Fruits / Dietary Fiber (g)  Apple with skin, 1 medium / 4.4 g  Sweetened applesauce,  cup / 1.5 g  Banana,  medium / 1.5 g  Grapes, 10 grapes / 0.4 g  Orange, 1 small / 2.3 g  Raisin, 1.5 oz / 1.6 g  Melon, 1 cup / 1.4 g Vegetables / Dietary Fiber (g)  Green beans (canned),  cup / 1.3 g  Carrots (cooked),  cup / 2.3 g  Broccoli (cooked),  cup / 2.8 g  Peas (cooked),  cup / 4.4 g  Mashed potatoes,  cup / 1.6 g  Lettuce, 1 cup / 0.5 g  Corn (canned),  cup / 1.6 g  Tomato,  cup / 1.1 g Document Released: 09/06/2005 Document Revised: 03/07/2012 Document Reviewed: 12/09/2011 [employer] Patient Information 2015 [place], [place]. This information is not intended to replace advice given to you by your health care provider. Make sure you discuss any questions you have with your health care provider.

## 2014-12-19 LAB — VITAMIN D 25 HYDROXY (VIT D DEFICIENCY, FRACTURES): VIT D 25 HYDROXY: 12 ng/mL — AB (ref 30–100)

## 2014-12-19 LAB — HEMOGLOBIN A1C
HEMOGLOBIN A1C: 5.3 % (ref <5.7)
Mean Plasma Glucose: 105 mg/dL (ref <117)

## 2014-12-25 ENCOUNTER — Other Ambulatory Visit: Payer: Self-pay

## 2014-12-25 MED ORDER — VITAMIN D (ERGOCALCIFEROL) 1.25 MG (50000 UNIT) PO CAPS: 50000.0000 [IU] | ORAL_CAPSULE | ORAL | Status: DC

## 2014-12-27 ENCOUNTER — Telehealth: Payer: Self-pay

## 2014-12-27 NOTE — Telephone Encounter (Signed)
Patient is aware of his lab results 

## 2014-12-27 NOTE — Telephone Encounter (Signed)
-----   Message from Lorayne Marek, MD sent at 12/19/2014 11:22 AM EDT ----- Blood work reviewed, noticed low vitamin D, call patient advise to start ergocalciferol 50,000 units once a week for the duration of  12 weeks. Also noted borderline elevated WBC count, will repeat the test on the following visit.

## 2014-12-31 ENCOUNTER — Ambulatory Visit: Payer: Self-pay | Attending: Internal Medicine

## 2015-01-01 ENCOUNTER — Ambulatory Visit: Payer: Self-pay | Attending: Internal Medicine

## 2015-03-08 ENCOUNTER — Encounter (HOSPITAL_COMMUNITY): Payer: Self-pay | Admitting: Emergency Medicine

## 2015-03-08 ENCOUNTER — Emergency Department (HOSPITAL_COMMUNITY): Payer: Self-pay

## 2015-03-08 ENCOUNTER — Emergency Department (HOSPITAL_COMMUNITY)
Admission: EM | Admit: 2015-03-08 | Discharge: 2015-03-08 | Disposition: A | Discharge status: Home / Self Care | Payer: Self-pay | Attending: Emergency Medicine | Admitting: Emergency Medicine

## 2015-03-08 DIAGNOSIS — J0100 Acute maxillary sinusitis, unspecified: Secondary | ICD-10-CM | POA: Insufficient documentation

## 2015-03-08 DIAGNOSIS — Z720 Tobacco use: Secondary | ICD-10-CM | POA: Insufficient documentation

## 2015-03-08 DIAGNOSIS — R059 Cough, unspecified: Secondary | ICD-10-CM

## 2015-03-08 DIAGNOSIS — S39012A Strain of muscle, fascia and tendon of lower back, initial encounter: Secondary | ICD-10-CM | POA: Insufficient documentation

## 2015-03-08 DIAGNOSIS — Y998 Other external cause status: Secondary | ICD-10-CM | POA: Insufficient documentation

## 2015-03-08 DIAGNOSIS — R05 Cough: Secondary | ICD-10-CM

## 2015-03-08 DIAGNOSIS — Z79899 Other long term (current) drug therapy: Secondary | ICD-10-CM | POA: Insufficient documentation

## 2015-03-08 DIAGNOSIS — G8929 Other chronic pain: Secondary | ICD-10-CM | POA: Insufficient documentation

## 2015-03-08 DIAGNOSIS — G43909 Migraine, unspecified, not intractable, without status migrainosus: Secondary | ICD-10-CM | POA: Insufficient documentation

## 2015-03-08 DIAGNOSIS — Z791 Long term (current) use of non-steroidal anti-inflammatories (NSAID): Secondary | ICD-10-CM | POA: Insufficient documentation

## 2015-03-08 DIAGNOSIS — Y9389 Activity, other specified: Secondary | ICD-10-CM | POA: Insufficient documentation

## 2015-03-08 DIAGNOSIS — X58XXXA Exposure to other specified factors, initial encounter: Secondary | ICD-10-CM | POA: Insufficient documentation

## 2015-03-08 DIAGNOSIS — Y9289 Other specified places as the place of occurrence of the external cause: Secondary | ICD-10-CM | POA: Insufficient documentation

## 2015-03-08 LAB — URINALYSIS, ROUTINE W REFLEX MICROSCOPIC
BILIRUBIN URINE: NEGATIVE
Glucose, UA: NEGATIVE mg/dL
HGB URINE DIPSTICK: NEGATIVE
Ketones, ur: NEGATIVE mg/dL
LEUKOCYTES UA: NEGATIVE
NITRITE: NEGATIVE
PH: 6.5 (ref 5.0–8.0)
Protein, ur: NEGATIVE mg/dL
Urobilinogen, UA: 0.2 mg/dL (ref 0.0–1.0)

## 2015-03-08 MED ORDER — CYCLOBENZAPRINE HCL 10 MG PO TABS: 10.0000 mg | ORAL_TABLET | Freq: Three times a day (TID) | ORAL | Status: DC | PRN

## 2015-03-08 MED ORDER — TRAMADOL HCL 50 MG PO TABS: 50.0000 mg | ORAL_TABLET | Freq: Four times a day (QID) | ORAL | Status: DC | PRN

## 2015-03-08 MED ORDER — MOMETASONE FUROATE 50 MCG/ACT NA SUSP: 2.0000 | Freq: Every day | NASAL | Status: DC

## 2015-03-08 NOTE — ED Provider Notes (Signed)
CSN: [id number]     Arrival date & time 03/08/15  1833 History   First MD Initiated Contact with Patient 03/08/15 1911     Chief Complaint  Patient presents with  . Headache     (Consider location/radiation/quality/duration/timing/severity/associated sxs/prior Treatment) HPI Comments: Patient presents to the emergency Department with multiple complaints. Patient reports that he has been experiencing headache, nasal congestion and postnasal drip for 2 or 3 days. He has had some cough but it is mild and nonproductive. Patient has also noticed pain in the left lower back. He reports that the pain is dull and constant, but worsens if he twists from side to side or bends over. He denies injury. Patient has not noticed any urinary symptoms.  Patient is a 39 y.o. male presenting with headaches.  Headache Associated symptoms: back pain, congestion, cough and sinus pressure     Past Medical History  Diagnosis Date  . Sciatic pain   . Chronic back pain   . Migraine    History reviewed. No pertinent past surgical history. Family History  Problem Relation Age of Onset  . Hypertension Mother   . Heart disease Mother   . Stroke Mother   . Diabetes Maternal Aunt   . Stroke Maternal Aunt   . Cancer Maternal Uncle   . Diabetes Maternal Grandmother   . Cancer Maternal Grandmother   . Diabetes Maternal Grandfather   . Cancer Paternal Grandfather    History  Substance Use Topics  . Smoking status: Current Some Day Smoker -- 0.50 packs/day for 4 years    Types: Cigarettes  . Smokeless tobacco: Not on file  . Alcohol Use: Yes     Comment: occ    Review of Systems  HENT: Positive for congestion and sinus pressure.   Respiratory: Positive for cough.   Musculoskeletal: Positive for back pain.  Neurological: Positive for headaches.  All other systems reviewed and are negative.     Allergies  Review of patient's allergies indicates no known allergies.  Home Medications   Prior to  Admission medications   Medication Sig Start Date End Date Taking? Authorizing Provider  chlorpheniramine (ALLER-CHLOR) 4 MG tablet Take 4 mg by mouth 2 (two) times daily as needed for allergies.   Yes Historical Provider, MD  HYDROcodone-acetaminophen (NORCO/VICODIN) 5-325 MG per tablet 1 or 2 tabs PO q6 hours prn pain 04/06/13   Francine Graven, DO  naproxen (NAPROSYN) 250 MG tablet Take 1 tablet (250 mg total) by mouth 2 (two) times daily with a meal. 04/06/13   Francine Graven, DO  ondansetron (ZOFRAN) 4 MG tablet Take 1 tablet (4 mg total) by mouth every 8 (eight) hours as needed for nausea. 04/06/13   Francine Graven, DO  SUMAtriptan (IMITREX) 50 MG tablet Take 1 tablet (50 mg total) by mouth every 2 (two) hours as needed for migraine or headache. May repeat in 2 hours if headache persists or recurs. 07/25/13   Evalee Jefferson, PA-C  Vitamin D, Ergocalciferol, (DRISDOL) 50000 UNITS CAPS capsule Take 1 capsule (50,000 Units total) by mouth every 7 (seven) days. 12/25/14   Lorayne Marek, MD   BP 142/101 mmHg  Pulse 65  Temp(Src) 97.7 F (36.5 C) (Oral)  Resp 18  Ht 6\' 3"  (1.905 m)  Wt 240 lb (108.863 kg)  BMI 30.00 kg/m2  SpO2 100% Physical Exam  Constitutional: He is oriented to person, place, and time. He appears well-developed and well-nourished. No distress.  HENT:  Head: Normocephalic and atraumatic.  Right  Ear: Hearing normal.  Left Ear: Hearing normal.  Nose: Mucosal edema present. Right sinus exhibits maxillary sinus tenderness. Left sinus exhibits maxillary sinus tenderness.  Mouth/Throat: Oropharynx is clear and moist and mucous membranes are normal.  Eyes: Conjunctivae and EOM are normal. Pupils are equal, round, and reactive to light.  Neck: Normal range of motion. Neck supple.  Cardiovascular: Regular rhythm, S1 normal and S2 normal.  Exam reveals no gallop and no friction rub.   No murmur heard. Pulmonary/Chest: Effort normal and breath sounds normal. No respiratory distress.  He exhibits no tenderness.  Abdominal: Soft. Normal appearance and bowel sounds are normal. There is no hepatosplenomegaly. There is no tenderness. There is no rebound, no guarding, no tenderness at McBurney's point and negative Murphy's sign. No hernia.  Musculoskeletal: Normal range of motion.       Lumbar back: He exhibits tenderness. He exhibits no bony tenderness.       Back:  Neurological: He is alert and oriented to person, place, and time. He has normal strength. No cranial nerve deficit or sensory deficit. Coordination normal. GCS eye subscore is 4. GCS verbal subscore is 5. GCS motor subscore is 6.  Skin: Skin is warm, dry and intact. No rash noted. No cyanosis.  Psychiatric: He has a normal mood and affect. His speech is normal and behavior is normal. Thought content normal.  Nursing note and vitals reviewed.   ED Course  Procedures (including critical care time) Labs Review Labs Reviewed  URINALYSIS, ROUTINE W REFLEX MICROSCOPIC (NOT AT [facility]) - Abnormal; Notable for the following:    Specific Gravity, Urine <1.005 (*)    All other components within normal limits    Imaging Review Dg Chest 2 View  03/08/2015   CLINICAL DATA:  39 year old male with chest congestion and headache for 3 days. Initial encounter.  EXAM: CHEST  2 VIEW  COMPARISON:  None.  FINDINGS: Normal lung volumes. Normal cardiac size and mediastinal contours. Visualized tracheal air column is within normal limits. The lungs are clear. No pneumothorax or effusion. No acute osseous abnormality identified.  IMPRESSION: Negative, no acute cardiopulmonary abnormality.   Electronically Signed   By: Genevie Ann M.D.   On: 03/08/2015 20:12     EKG Interpretation None      MDM   Final diagnoses:  Cough   sinusitis Lumbar strain  Patient presents to the emergency department for evaluation of headache, sinus congestion, cough. Symptoms are consistent with upper respiratory infection, possibly sinusitis. Patient has  normal neurologic exam. Symptoms are consistent with sinus etiology, no concern for intracranial etiology, no imaging necessary. Chest x-ray was performed, no evidence of pneumonia. Patient complaining of left flank pain. This seems musculoskeletal in nature, is reproducible with motion. Urinalysis was negative.    Orpah Greek, MD 03/08/15 2038

## 2015-03-08 NOTE — ED Notes (Addendum)
Pt reports nasal congestion, cough,headache,left flank pain since Thursday.

## 2015-03-08 NOTE — Discharge Instructions (Signed)
Back Pain, Adult Low back pain is very common. About 1 in 5 people have back pain.The cause of low back pain is rarely dangerous. The pain often gets better over time.About half of people with a sudden onset of back pain feel better in just 2 weeks. About 8 in 10 people feel better by 6 weeks.  CAUSES Some common causes of back pain include:  Strain of the muscles or ligaments supporting the spine.  Wear and tear (degeneration) of the spinal discs.  Arthritis.  Direct injury to the back. DIAGNOSIS Most of the time, the direct cause of low back pain is not known.However, back pain can be treated effectively even when the exact cause of the pain is unknown.Answering your caregiver's questions about your overall health and symptoms is one of the most accurate ways to make sure the cause of your pain is not dangerous. If your caregiver needs more information, he or she may order lab work or imaging tests (X-rays or MRIs).However, even if imaging tests show changes in your back, this usually does not require surgery. HOME CARE INSTRUCTIONS For many people, back pain returns.Since low back pain is rarely dangerous, it is often a condition that people can learn to [facility] their own.   Remain active. It is stressful on the back to sit or stand in one place. Do not sit, drive, or stand in one place for more than 30 minutes at a time. Take short walks on level surfaces as soon as pain allows.Try to increase the length of time you walk each day.  Do not stay in bed.Resting more than 1 or 2 days can delay your recovery.  Do not avoid exercise or work.Your body is made to move.It is not dangerous to be active, even though your back may hurt.Your back will likely heal faster if you return to being active before your pain is gone.  Pay attention to your body when you bend and lift. Many people have less discomfortwhen lifting if they bend their knees, keep the load close to their bodies,and  avoid twisting. Often, the most comfortable positions are those that put less stress on your recovering back.  Find a comfortable position to sleep. Use a firm mattress and lie on your side with your knees slightly bent. If you lie on your back, put a pillow under your knees.  Only take over-the-counter or prescription medicines as directed by your caregiver. Over-the-counter medicines to reduce pain and inflammation are often the most helpful.Your caregiver may prescribe muscle relaxant drugs.These medicines help dull your pain so you can more quickly return to your normal activities and healthy exercise.  Put ice on the injured area.  Put ice in a plastic bag.  Place a towel between your skin and the bag.  Leave the ice on for 15-20 minutes, 03-04 times a day for the first 2 to 3 days. After that, ice and heat may be alternated to reduce pain and spasms.  Ask your caregiver about trying back exercises and gentle massage. This may be of some benefit.  Avoid feeling anxious or stressed.Stress increases muscle tension and can worsen back pain.It is important to recognize when you are anxious or stressed and learn ways to manage it.Exercise is a great option. SEEK MEDICAL CARE IF:  You have pain that is not relieved with rest or medicine.  You have pain that does not improve in 1 week.  You have new symptoms.  You are generally not feeling well. SEEK  IMMEDIATE MEDICAL CARE IF:   You have pain that radiates from your back into your legs.  You develop new bowel or bladder control problems.  You have unusual weakness or numbness in your arms or legs.  You develop nausea or vomiting.  You develop abdominal pain.  You feel faint. Document Released: 09/06/2005 Document Revised: 03/07/2012 Document Reviewed: 01/08/2014 [facility] Patient Information 2015 [place], [place]. This information is not intended to replace advice given to you by your health care provider. Make sure you  discuss any questions you have with your health care provider.  Sinusitis Sinusitis is redness, soreness, and inflammation of the paranasal sinuses. Paranasal sinuses are air pockets within the bones of your face (beneath the eyes, the middle of the forehead, or above the eyes). In healthy paranasal sinuses, mucus is able to drain out, and air is able to circulate through them by way of your nose. However, when your paranasal sinuses are inflamed, mucus and air can become trapped. This can allow bacteria and other germs to grow and cause infection. Sinusitis can develop quickly and last only a short time (acute) or continue over a long period (chronic). Sinusitis that lasts for more than 12 weeks is considered chronic.  CAUSES  Causes of sinusitis include:  Allergies.  Structural abnormalities, such as displacement of the cartilage that separates your nostrils (deviated septum), which can decrease the air flow through your nose and sinuses and affect sinus drainage.  Functional abnormalities, such as when the small hairs (cilia) that line your sinuses and help remove mucus do not work properly or are not present. SIGNS AND SYMPTOMS  Symptoms of acute and chronic sinusitis are the same. The primary symptoms are pain and pressure around the affected sinuses. Other symptoms include:  Upper toothache.  Earache.  Headache.  Bad breath.  Decreased sense of smell and taste.  A cough, which worsens when you are lying flat.  Fatigue.  Fever.  Thick drainage from your nose, which often is green and may contain pus (purulent).  Swelling and warmth over the affected sinuses. DIAGNOSIS  Your health care provider will perform a physical exam. During the exam, your health care provider may:  Look in your nose for signs of abnormal growths in your nostrils (nasal polyps).  Tap over the affected sinus to check for signs of infection.  View the inside of your sinuses (endoscopy) using an  imaging device that has a light attached (endoscope). If your health care provider suspects that you have chronic sinusitis, one or more of the following tests may be recommended:  Allergy tests.  Nasal culture. A sample of mucus is taken from your nose, sent to a lab, and screened for bacteria.  Nasal cytology. A sample of mucus is taken from your nose and examined by your health care provider to determine if your sinusitis is related to an allergy. TREATMENT  Most cases of acute sinusitis are related to a viral infection and will resolve on their own within 10 days. Sometimes medicines are prescribed to help relieve symptoms (pain medicine, decongestants, nasal steroid sprays, or saline sprays).  However, for sinusitis related to a bacterial infection, your health care provider will prescribe antibiotic medicines. These are medicines that will help kill the bacteria causing the infection.  Rarely, sinusitis is caused by a fungal infection. In theses cases, your health care provider will prescribe antifungal medicine. For some cases of chronic sinusitis, surgery is needed. Generally, these are cases in which sinusitis recurs more  than 3 times per year, despite other treatments. HOME CARE INSTRUCTIONS   Drink plenty of water. Water helps thin the mucus so your sinuses can drain more easily.  Use a humidifier.  Inhale steam 3 to 4 times a day (for example, sit in the bathroom with the shower running).  Apply a warm, moist washcloth to your face 3 to 4 times a day, or as directed by your health care provider.  Use saline nasal sprays to help moisten and clean your sinuses.  Take medicines only as directed by your health care provider.  If you were prescribed either an antibiotic or antifungal medicine, finish it all even if you start to feel better. SEEK IMMEDIATE MEDICAL CARE IF:  You have increasing pain or severe headaches.  You have nausea, vomiting, or drowsiness.  You have  swelling around your face.  You have vision problems.  You have a stiff neck.  You have difficulty breathing. MAKE SURE YOU:   Understand these instructions.  Will watch your condition.  Will get help right away if you are not doing well or get worse. Document Released: 09/06/2005 Document Revised: 01/21/2014 Document Reviewed: 09/21/2011 [facility] Patient Information 2015 [place], [place]. This information is not intended to replace advice given to you by your health care provider. Make sure you discuss any questions you have with your health care provider.

## 2015-08-21 DIAGNOSIS — R55 Syncope and collapse: Secondary | ICD-10-CM

## 2015-08-21 HISTORY — DX: Syncope and collapse: R55

## 2015-09-16 ENCOUNTER — Encounter (HOSPITAL_COMMUNITY): Payer: Self-pay | Admitting: *Deleted

## 2015-09-16 ENCOUNTER — Emergency Department (HOSPITAL_COMMUNITY)
Admission: EM | Admit: 2015-09-16 | Discharge: 2015-09-16 | Disposition: A | Discharge status: Home / Self Care | Payer: Self-pay | Attending: Emergency Medicine | Admitting: Emergency Medicine

## 2015-09-16 DIAGNOSIS — F17210 Nicotine dependence, cigarettes, uncomplicated: Secondary | ICD-10-CM | POA: Insufficient documentation

## 2015-09-16 DIAGNOSIS — Z8679 Personal history of other diseases of the circulatory system: Secondary | ICD-10-CM | POA: Insufficient documentation

## 2015-09-16 DIAGNOSIS — R55 Syncope and collapse: Secondary | ICD-10-CM | POA: Insufficient documentation

## 2015-09-16 DIAGNOSIS — G8929 Other chronic pain: Secondary | ICD-10-CM | POA: Insufficient documentation

## 2015-09-16 DIAGNOSIS — R42 Dizziness and giddiness: Secondary | ICD-10-CM | POA: Insufficient documentation

## 2015-09-16 LAB — CBC WITH DIFFERENTIAL/PLATELET
Basophils Absolute: 0 10*3/uL (ref 0.0–0.1)
Basophils Relative: 0 %
Eosinophils Absolute: 0.1 10*3/uL (ref 0.0–0.7)
Eosinophils Relative: 1 %
HCT: 45.2 % (ref 39.0–52.0)
Hemoglobin: 14.8 g/dL (ref 13.0–17.0)
Lymphocytes Relative: 31 %
Lymphs Abs: 2.7 10*3/uL (ref 0.7–4.0)
MCH: 30.1 pg (ref 26.0–34.0)
MCHC: 32.7 g/dL (ref 30.0–36.0)
MCV: 91.9 fL (ref 78.0–100.0)
Monocytes Absolute: 0.5 10*3/uL (ref 0.1–1.0)
Monocytes Relative: 5 %
Neutro Abs: 5.5 10*3/uL (ref 1.7–7.7)
Neutrophils Relative %: 63 %
Platelets: 250 10*3/uL (ref 150–400)
RBC: 4.92 MIL/uL (ref 4.22–5.81)
RDW: 13.1 % (ref 11.5–15.5)
WBC: 8.7 10*3/uL (ref 4.0–10.5)

## 2015-09-16 LAB — COMPREHENSIVE METABOLIC PANEL
ALT: 14 U/L — ABNORMAL LOW (ref 17–63)
ANION GAP: 7 (ref 5–15)
AST: 18 U/L (ref 15–41)
Albumin: 4.3 g/dL (ref 3.5–5.0)
Alkaline Phosphatase: 44 U/L (ref 38–126)
BUN: 14 mg/dL (ref 6–20)
CALCIUM: 9.3 mg/dL (ref 8.9–10.3)
CHLORIDE: 106 mmol/L (ref 101–111)
CO2: 26 mmol/L (ref 22–32)
CREATININE: 1.17 mg/dL (ref 0.61–1.24)
Glucose, Bld: 126 mg/dL — ABNORMAL HIGH (ref 65–99)
Potassium: 4.2 mmol/L (ref 3.5–5.1)
SODIUM: 139 mmol/L (ref 135–145)
Total Bilirubin: 1.3 mg/dL — ABNORMAL HIGH (ref 0.3–1.2)
Total Protein: 7.3 g/dL (ref 6.5–8.1)

## 2015-09-16 NOTE — ED Provider Notes (Signed)
CSN: [id number]     Arrival date & time 09/16/15  1403 History   First MD Initiated Contact with Patient 09/16/15 1619     Chief Complaint  Patient presents with  . Loss of Consciousness      HPI  Patient presents with a waist after an episode of syncope. He states that he had one or 2 beers during the day yesterday at approximately 36 this morning was sitting in a kitchen table with a friend. He was looking at some football scores" on his phone. The abdomen going to drink water. Upon standing he murmurs feeling dizzy and lightheaded. Next thing he knows he is on the floor and his friend is standing next to him or kneeling next exam. Friend states that he did a few steps and said "well I'm dizzy". HEENT up on the floor. Friend attempted to help him get up and he again had syncope. No symptoms since that time. Eating and drinking normally. He has been ambulatory without pain lightheadedness or other complaint. Did not feel dizzy lightheaded pain or any symptoms prior to standing that he can recall  Past Medical History  Diagnosis Date  . Sciatic pain   . Chronic back pain   . Migraine    History reviewed. No pertinent past surgical history. Family History  Problem Relation Age of Onset  . Hypertension Mother   . Heart disease Mother   . Stroke Mother   . Diabetes Maternal Aunt   . Stroke Maternal Aunt   . Cancer Maternal Uncle   . Diabetes Maternal Grandmother   . Cancer Maternal Grandmother   . Diabetes Maternal Grandfather   . Cancer Paternal Grandfather    Social History  Substance Use Topics  . Smoking status: Current Some Day Smoker -- 0.50 packs/day for 4 years    Types: Cigarettes  . Smokeless tobacco: None  . Alcohol Use: Yes     Comment: occ    Review of Systems  Constitutional: Negative for fever, chills, diaphoresis, appetite change and fatigue.  HENT: Negative for mouth sores, sore throat and trouble swallowing.   Eyes: Negative for visual disturbance.   Respiratory: Negative for cough, chest tightness, shortness of breath and wheezing.   Cardiovascular: Negative for chest pain.  Gastrointestinal: Negative for nausea, vomiting, abdominal pain, diarrhea and abdominal distention.  Endocrine: Negative for polydipsia, polyphagia and polyuria.  Genitourinary: Negative for dysuria, frequency and hematuria.  Musculoskeletal: Negative for gait problem.  Skin: Negative for color change, pallor and rash.  Neurological: Positive for syncope. Negative for dizziness, light-headedness and headaches.  Hematological: Does not bruise/bleed easily.  Psychiatric/Behavioral: Negative for behavioral problems and confusion.      Allergies  Review of patient's allergies indicates no known allergies.  Home Medications   Prior to Admission medications   Not on File   BP 154/92 mmHg  Pulse 69  Temp(Src) 98.8 F (37.1 C) (Oral)  Resp 14  Ht 6\' 3"  (1.905 m)  Wt 240 lb (108.863 kg)  BMI 30.00 kg/m2  SpO2 100% Physical Exam  Constitutional: He is oriented to person, place, and time. He appears well-developed and well-nourished. No distress.  HENT:  Head: Normocephalic.  Eyes: Conjunctivae are normal. Pupils are equal, round, and reactive to light. No scleral icterus.  Neck: Normal range of motion. Neck supple. No thyromegaly present.  Cardiovascular: Normal rate, regular rhythm, S1 normal and S2 normal.  Exam reveals no gallop and no friction rub.   No murmur heard. No murmurs.  Regular rhythm. No ectopy.  Pulmonary/Chest: Effort normal and breath sounds normal. No respiratory distress. He has no wheezes. He has no rales.  Abdominal: Soft. Bowel sounds are normal. He exhibits no distension. There is no tenderness. There is no rebound.  Musculoskeletal: Normal range of motion.  Neurological: He is alert and oriented to person, place, and time.  Skin: Skin is warm and dry. No rash noted.  Psychiatric: He has a normal mood and affect. His behavior is  normal.    ED Course  Procedures (including critical care time) Labs Review Labs Reviewed  COMPREHENSIVE METABOLIC PANEL - Abnormal; Notable for the following:    Glucose, Bld 126 (*)    ALT 14 (*)    Total Bilirubin 1.3 (*)    All other components within normal limits  CBC WITH DIFFERENTIAL/PLATELET    Imaging Review No results found. I have personally reviewed and evaluated these images and lab results as part of my medical decision-making.   EKG Interpretation None      MDM   Final diagnoses:  Syncope and collapse    Patient is a low risk patient. Feels well now. Likely orthostatic syncope. No stimulus for vagal episode. Normal heart exam a negative heart history. For discharge. Asked to recheck with recurrence. Routine cardiology follow-up.    Tanna Furry, MD 09/16/15 1728

## 2015-09-16 NOTE — ED Notes (Signed)
Pt made aware to return if symptoms worsen or if any life threatening symptoms occur.   

## 2015-09-16 NOTE — ED Notes (Signed)
MD at bedside. 

## 2015-09-16 NOTE — Discharge Instructions (Signed)
Return to ER if any recurrence. Routine cardiology follow-up. Syncope Syncope is a medical term for fainting or passing out. This means you lose consciousness and drop to the ground. People are generally unconscious for less than 5 minutes. You may have some muscle twitches for up to 15 seconds before waking up and returning to normal. Syncope occurs more often in older adults, but it can happen to anyone. While most causes of syncope are not dangerous, syncope can be a sign of a serious medical problem. It is important to seek medical care.  CAUSES  Syncope is caused by a sudden drop in blood flow to the brain. The specific cause is often not determined. Factors that can bring on syncope include:  Taking medicines that lower blood pressure.  Sudden changes in posture, such as standing up quickly.  Taking more medicine than prescribed.  Standing in one place for too long.  Seizure disorders.  Dehydration and excessive exposure to heat.  Low blood sugar (hypoglycemia).  Straining to have a bowel movement.  Heart disease, irregular heartbeat, or other circulatory problems.  Fear, emotional distress, seeing blood, or severe pain. SYMPTOMS  Right before fainting, you may:  Feel dizzy or light-headed.  Feel nauseous.  See all white or all black in your field of vision.  Have cold, clammy skin. DIAGNOSIS  Your health care provider will ask about your symptoms, perform a physical exam, and perform an electrocardiogram (ECG) to record the electrical activity of your heart. Your health care provider may also perform other heart or blood tests to determine the cause of your syncope which may include:  Transthoracic echocardiogram (TTE). During echocardiography, sound waves are used to evaluate how blood flows through your heart.  Transesophageal echocardiogram (TEE).  Cardiac monitoring. This allows your health care provider to monitor your heart rate and rhythm in real  time.  Holter monitor. This is a portable device that records your heartbeat and can help diagnose heart arrhythmias. It allows your health care provider to track your heart activity for several days, if needed.  Stress tests by exercise or by giving medicine that makes the heart beat faster. TREATMENT  In most cases, no treatment is needed. Depending on the cause of your syncope, your health care provider may recommend changing or stopping some of your medicines. HOME CARE INSTRUCTIONS  Have someone stay with you until you feel stable.  Do not drive, use machinery, or play sports until your health care provider says it is okay.  Keep all follow-up appointments as directed by your health care provider.  Lie down right away if you start feeling like you might faint. Breathe deeply and steadily. Wait until all the symptoms have passed.  Drink enough fluids to keep your urine clear or pale yellow.  If you are taking blood pressure or heart medicine, get up slowly and take several minutes to sit and then stand. This can reduce dizziness. SEEK IMMEDIATE MEDICAL CARE IF:   You have a severe headache.  You have unusual pain in the chest, abdomen, or back.  You are bleeding from your mouth or rectum, or you have black or tarry stool.  You have an irregular or very fast heartbeat.  You have pain with breathing.  You have repeated fainting or seizure-like jerking during an episode.  You faint when sitting or lying down.  You have confusion.  You have trouble walking.  You have severe weakness.  You have vision problems. If you fainted, call your  local emergency services (911 in [place] Do not drive yourself to the hospital.    This information is not intended to replace advice given to you by your health care provider. Make sure you discuss any questions you have with your health care provider.   Document Released: 09/06/2005 Document Revised: 01/21/2015 Document Reviewed:  11/05/2011 Elsevier Interactive Patient Education [employer].

## 2015-09-16 NOTE — ED Notes (Signed)
Pt reports LOC this morning (0300 or 0400).  States he got up and "passed out". Reports hitting head on floor. Pt. Alert now, nad.

## 2015-09-26 ENCOUNTER — Ambulatory Visit (INDEPENDENT_AMBULATORY_CARE_PROVIDER_SITE_OTHER): Payer: Self-pay | Admitting: Internal Medicine

## 2015-09-26 ENCOUNTER — Encounter: Payer: Self-pay | Admitting: Internal Medicine

## 2015-09-26 VITALS — BP 140/100 | HR 62 | Ht 73.0 in | Wt 221.0 lb

## 2015-09-26 DIAGNOSIS — I10 Essential (primary) hypertension: Secondary | ICD-10-CM

## 2015-09-26 DIAGNOSIS — R55 Syncope and collapse: Secondary | ICD-10-CM

## 2015-09-26 NOTE — Patient Instructions (Signed)
Your physician recommends that you schedule a follow-up appointment in: 4-6 weeks   Your physician recommends that you continue on your current medications as directed. Please refer to the Current Medication list given to you today.  Thank you for choosing [facility]!

## 2015-09-26 NOTE — Progress Notes (Signed)
Cardiology Office Note   Date:  09/26/2015   ID:  Maxwell White, DOB 1976/02/06, MRN [id number]  PCP:  No PCP Per Patient  Cardiologist:   Dorris Carnes, MD   Pt presents for f/u of syncope  History of Present Illness: Maxwell White is a 40 y.o. 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   No current facility-administered medications for this visit.    Allergies:   Review of patient's allergies indicates no known allergies.   Past Medical History  Diagnosis Date  . Sciatic pain   . Chronic back pain   . Migraine     No past surgical history on file.   Social History:  The patient  reports that he has been smoking Cigarettes.  He started smoking about 19 years ago. He has a 2 pack-year smoking history. He does not have any smokeless tobacco history on file. He reports that he drinks alcohol. He reports that he does not use illicit drugs.   Family History:  The patient's family history includes Cancer in his maternal grandmother, maternal uncle, and paternal grandfather; Diabetes in his maternal aunt, maternal grandfather, and maternal grandmother; Heart disease in his mother; Hypertension in his mother; Stroke in his maternal aunt and mother.    ROS:  Please see the history of present illness. All other systems are reviewed and  Negative to the above problem except as noted.    PHYSICAL EXAM: VS:  BP 140/100 mmHg  Pulse 62  Ht 6\' 1"  (1.854 m)  Wt 100.245 kg (221 lb)  BMI 29.16  kg/m2  SpO2 98%  GEN: Well nourished, well developed, in no acute distress HEENT: normal Neck: no JVD, carotid bruits, or masses Cardiac: RRR; no murmurs, rubs, or gallops,no edema  Respiratory:  clear to auscultation bilaterally, normal work of breathing GI: soft, nontender, nondistended, + BS  No hepatomegaly  MS: no deformity Moving all extremities   Skin: warm and dry, no rash Neuro:  Strength and sensation are intact Psych: euthymic mood, full affect   EKG:  EKG is not ordered today.  In ER SR 67 bpm  LAFB      Lipid Panel No results found for: CHOL, TRIG, HDL, CHOLHDL, VLDL, LDLCALC, LDLDIRECT    Wt Readings from Last 3 Encounters:  09/26/15 100.245 kg (221 lb)  09/16/15 108.863 kg (240 lb)  03/08/15 108.863 kg (240 lb)      ASSESSMENT AND PLAN:  1  Syncope  Sounds orthostatic in origin though he is not orthostatic on exam  His BP is up today  On my check 130/92  I am reluctant to add medication for now  I have considered b blocker but  If BP goes down more may make dizziness worse    Follow BP  For now  Stay hydrated  If dizzy needs to sit where  he is and hydrate before getting back up  He is aware of this  2.  HTN  Needs to be followed   Would not add Rx fro now.  F/U appt in 4 to 6 wks to check on dizziness and HTN        Signed, Dorris Carnes, MD  09/26/2015 9:28 AM    [address], [place], [place]  [address] Phone: [phone]; Fax: [phone]

## 2015-10-29 ENCOUNTER — Encounter: Payer: Self-pay | Admitting: Physician Assistant

## 2015-10-29 ENCOUNTER — Ambulatory Visit (INDEPENDENT_AMBULATORY_CARE_PROVIDER_SITE_OTHER): Payer: Self-pay | Admitting: Physician Assistant

## 2015-10-29 VITALS — BP 134/88 | HR 90 | Ht 75.0 in | Wt 230.0 lb

## 2015-10-29 DIAGNOSIS — R030 Elevated blood-pressure reading, without diagnosis of hypertension: Secondary | ICD-10-CM

## 2015-10-29 DIAGNOSIS — R42 Dizziness and giddiness: Secondary | ICD-10-CM | POA: Insufficient documentation

## 2015-10-29 DIAGNOSIS — Z720 Tobacco use: Secondary | ICD-10-CM

## 2015-10-29 DIAGNOSIS — IMO0001 Reserved for inherently not codable concepts without codable children: Secondary | ICD-10-CM

## 2015-10-29 DIAGNOSIS — F17200 Nicotine dependence, unspecified, uncomplicated: Secondary | ICD-10-CM

## 2015-10-29 NOTE — Patient Instructions (Signed)
Your physician recommends that you schedule a follow-up appointment in: 2-3 months With Dr. Harrington Challenger  Your physician recommends that you continue on your current medications as directed. Please refer to the Current Medication list given to you today.  If you need a refill on your cardiac medications before your next appointment, please call your pharmacy.  Thank you for choosing [facility]!

## 2015-10-29 NOTE — Assessment & Plan Note (Signed)
Blood pressures have been stable at home. Patient will continue to monitor them and call us if they go up.

## 2015-10-29 NOTE — Assessment & Plan Note (Signed)
Importance of smoking cessation discussed with the patient in detail.

## 2015-10-29 NOTE — Assessment & Plan Note (Signed)
Patient has occasional dizziness when he bends over or stands up quickly. He knows to take his time. He has had no further syncope. He is staying hydrated. Follow-up with Dr. Harrington Challenger in 2-3 months.

## 2015-10-29 NOTE — Progress Notes (Signed)
Cardiology Office Note   Date:  10/29/2015   ID:  Maxwell White, DOB 1976-01-16, MRN [id number]  PCP:  No PCP Per Patient  Cardiologist:  Dr. Dorris Carnes  Chief Complaint: Occasional dizziness    History of Present Illness: Maxwell White is a 40 y.o. male who presents for follow-up of syncope. He saw Dr. Harrington Challenger 09/26/15 after he had a syncopal episode 09/16/15. She thought it sounded orthostatic although he was not orthostatic on exam. His blood pressure was actually up the day she saw him. She recommended mended hydration and follow-up hypertension. She considered beta blocker but did not want to lower his blood pressure.  Patient comes in today feeling quite well. He says he has occasional dizziness if he bends over or stands up quickly but no syncope. His blood pressures have been normal at home when he checks them and he is staying hydrated. Unfortunately he continues to smoke half a pack daily. He says he is trying to quit.    Past Medical History  Diagnosis Date  . Sciatic pain   . Chronic back pain   . Migraine     No past surgical history on file.   No current outpatient prescriptions on file.   No current facility-administered medications for this visit.    Allergies:   Review of patient's allergies indicates no known allergies.    Social History:  The patient  reports that he has been smoking Cigarettes.  He started smoking about 19 years ago. He has a 2 pack-year smoking history. He does not have any smokeless tobacco history on file. He reports that he drinks alcohol. He reports that he does not use illicit drugs.   Family History:  The patient's   family history includes Cancer in his maternal grandmother, maternal uncle, and paternal grandfather; Diabetes in his maternal aunt, maternal grandfather, and maternal grandmother; Heart disease in his mother; Hypertension in his mother; Stroke in his maternal aunt and mother.    ROS:  Please see the history of present  illness.   Otherwise, review of systems are positive for none.   All other systems are reviewed and negative.    PHYSICAL EXAM: VS:  BP 134/88 mmHg  Pulse 90  Ht 6\' 3"  (1.905 m)  Wt 230 lb (104.327 kg)  BMI 28.75 kg/m2  SpO2 97% , BMI Body mass index is 28.75 kg/(m^2). GEN: Well nourished, well developed, in no acute distress Neck: no JVD, HJR, carotid bruits, or masses Cardiac: RRR; positive S4, no murmurs, rubs, thrill or heave,  Respiratory:  clear to auscultation bilaterally, normal work of breathing GI: soft, nontender, nondistended, + BS MS: no deformity or atrophy Extremities: without cyanosis, clubbing, edema, good distal pulses bilaterally.  Skin: warm and dry, no rash Neuro:  Strength and sensation are intact    EKG:  EKG is not ordered today.    Recent Labs: 12/18/2014: TSH 0.431 09/16/2015: ALT 14*; BUN 14; Creatinine, Ser 1.17; Hemoglobin 14.8; Platelets 250; Potassium 4.2; Sodium 139    Lipid Panel No results found for: CHOL, TRIG, HDL, CHOLHDL, VLDL, LDLCALC, LDLDIRECT    Wt Readings from Last 3 Encounters:  10/29/15 230 lb (104.327 kg)  09/26/15 221 lb (100.245 kg)  09/16/15 240 lb (108.863 kg)      Other studies Reviewed: Additional studies/ records that were reviewed today include and review of the records demonstrates:   ASSESSMENT AND PLAN:  Dizziness Patient has occasional dizziness when he bends over or stands up  quickly. He knows to take his time. He has had no further syncope. He is staying hydrated. Follow-up with Dr. Harrington Challenger in 2-3 months.  Smoking Importance of smoking cessation discussed with the patient in detail.  Elevated BP Blood pressures have been stable at home. Patient will continue to monitor them and call us if they go up.    Signed, Ermalinda Barrios, PA-C  10/29/2015 1:16 PM    [facility] [place], [place], [place]  [address] Phone: [phone]; Fax: [phone]

## 2016-01-12 ENCOUNTER — Encounter: Payer: Self-pay | Admitting: Internal Medicine

## 2016-01-12 ENCOUNTER — Ambulatory Visit: Payer: Self-pay | Admitting: Internal Medicine

## 2016-11-24 ENCOUNTER — Encounter: Payer: Self-pay | Admitting: Physician Assistant

## 2016-11-24 DIAGNOSIS — Z87898 Personal history of other specified conditions: Secondary | ICD-10-CM | POA: Insufficient documentation

## 2016-11-24 NOTE — Progress Notes (Signed)
Cardiology Office Note    Date:  11/25/2016  ID:  Maxwell White, DOB 1976/06/18, MRN [id number] PCP:  No PCP Per Patient  Cardiologist:  Dr. Harrington Challenger  Chief Complaint: dizziness  History of Present Illness:  Maxwell White is a 41 y.o. male with history of syncope 08/2015, elevated BP, migraine, chronic back pain, tobacco abuse who presents for follow-up. Per review of chart he had a syncopal episode 08/2015. The ER note contains some dictation errors making interpretation challenging but it sounds like he had had a few beers, went to stand up in the kitchen, felt dizzy, and passed out. Apparently he had a black eye for a few weeks. He does not remember much about the event. Labs showed glucose 126, Cr 1.17, Hgb 14.8. Dr. Harrington Challenger saw him in follow-up and thought it sounded orthostatic but the clinical picture was unclear as he was not orthostatic on exam. His blood pressure was actually up the day she saw him. She recommended increased hydration and f/u of blood pressure.   He returns for yearly follow-up. He reports frequent episodes of dizziness that do occur with standing but as of the last year have sometimes happened while doing nothing at all. He recalls 2 episodes of what he thinks were full syncope within the last year but he is not 100% sure he passed out. The most recent one occurred while he was standing in the shower in his usual state of health about a month ago. He felt very dizzy and so he got out and braced himself against the toilet and the wall. He came to in this position and began to feel better but he's not sure if he fully passed out. He felt better very quickly. No post-ictal state or known seizure history or reported incontinence. He has not had any chest pain or dyspnea. He has felt his heart racing around these near-syncopal spells but states he also becomes very anxious when it happens. He reports his mom died in her 77s of an MI. Otherwise no family history of passing out or  sudden cardiac death. Admits to [facility] use but no other illicits. Episodes are not a/w use.   Past Medical History:  Diagnosis Date  . Chronic back pain   . Essential hypertension   . Migraine   . Sciatic pain   . Syncope 08/2015  . Tobacco abuse     History reviewed. No pertinent surgical history.  Current Medications: No outpatient prescriptions prior to visit.   No facility-administered medications prior to visit.      Allergies:   Patient has no known allergies.   Social History   Social History  . Marital status: Single    Spouse name: N/A  . Number of children: N/A  . Years of education: N/A   Social History Main Topics  . Smoking status: Current Some Day Smoker    Packs/day: 0.50    Years: 4.00    Types: Cigarettes    Start date: 09/20/1996  . Smokeless tobacco: Never Used  . Alcohol use 0.0 oz/week     Comment: occ  . Drug use: Yes    Types: Marijuana  . Sexual activity: Not Asked   Other Topics Concern  . None   Social History Narrative  . None     Family History:  Family History  Problem Relation Age of Onset  . Hypertension Mother   . Heart disease Mother   . Stroke Mother   . Diabetes  Maternal Aunt   . Stroke Maternal Aunt   . Cancer Maternal Uncle   . Diabetes Maternal Grandmother   . Cancer Maternal Grandmother   . Diabetes Maternal Grandfather   . Cancer Paternal Grandfather     ROS:   Please see the history of present illness. Sometimes his legs will fall asleep if seated in one position for a while. All other systems are reviewed and otherwise negative.    PHYSICAL EXAM:   VS:  BP 134/88   Pulse 66   Ht 6\' 3"  (1.905 m)   Wt 226 lb (102.5 kg)   SpO2 98%   BMI 28.25 kg/m   BMI: Body mass index is 28.25 kg/m. GEN: Well nourished, well developed WF, in no acute distress  HEENT: normocephalic, atraumatic Neck: no JVD, carotid bruits, or masses Cardiac: RRR; no murmurs, rubs, or gallops, no edema  Respiratory:  clear to  auscultation bilaterally, normal work of breathing GI: soft, nontender, nondistended, + BS MS: no deformity or atrophy  Skin: warm and dry, no rash Neuro:  Alert and Oriented x 3, Strength and sensation are intact, follows commands, right facial droop noted Psych: euthymic mood, full affect, lively affect  Wt Readings from Last 3 Encounters:  11/25/16 226 lb (102.5 kg)  10/29/15 230 lb (104.3 kg)  09/26/15 221 lb (100.2 kg)      Studies/Labs Reviewed:   EKG:  EKG was ordered today and personally reviewed by me and demonstrates NSR 62bpm, RSR with incomplete RBBB and anterior fascicular block, TWI III, avF. TWI is new from before.  Recent Labs: No results found for requested labs within last 8760 hours.   Lipid Panel No results found for: CHOL, TRIG, HDL, CHOLHDL, VLDL, LDLCALC, LDLDIRECT  Additional studies/ records that were reviewed today include: Summarized above.    ASSESSMENT & PLAN:   1. Syncope/dizziness - prior episodes were deemed orthostatic but he's had a couple episodes of dizziness that sound unprovoked. Further workup is indicated. Will proceed with 2D Echocardiogram and 30 day event monitor. I told him if event monitor is unrevealing, I would recommend he be set up with EP to have a loop recorder placed. Will check basic labs/lytes today. ER precautions reviewed - if he has another episode of syncope I would recommend going straight to the ED for evaluation. We discussed no driving but he states he doesn't drive because he doesn't have a license. I also advised not to operate heavy machinery while these episodes are being worked up. Physical exam is unremarkable. 2. Abnormal EKG - new TWI. No anginal symptoms but he has a family history of early CAD. Obtain 2D echocardiogram to further evaluate. Will also check nonfasting lipids and CMET (last ate 3 hours ago). 3. Essential HTN - BP running borderline elevated. However, given intermittent dizziness of unclear cause,  would not treat this aggressively until we know more about the etiology of his syncope. 4. Tobacco abuse - counseled regarding importance of cessation.  Disposition: F/u with APP or Dr. Harrington Challenger after above studies are completed.   Medication Adjustments/Labs and Tests Ordered: Current medicines are reviewed at length with the patient today.  Concerns regarding medicines are outlined above. Medication changes, Labs and Tests ordered today are summarized above and listed in the Patient Instructions accessible in Encounters.   Signed, Melina Copa PA-C  11/25/2016 3:12 PM    [facility] [place] [address] [place], [place] [address] Ph: [phone]; Fax [phone]

## 2016-11-25 ENCOUNTER — Other Ambulatory Visit (HOSPITAL_COMMUNITY)
Admission: RE | Admit: 2016-11-25 | Discharge: 2016-11-25 | Disposition: A | Discharge status: Home / Self Care | Payer: Self-pay | Source: Ambulatory Visit | Attending: Physician Assistant | Admitting: Physician Assistant

## 2016-11-25 ENCOUNTER — Ambulatory Visit (INDEPENDENT_AMBULATORY_CARE_PROVIDER_SITE_OTHER): Payer: Self-pay

## 2016-11-25 ENCOUNTER — Ambulatory Visit (INDEPENDENT_AMBULATORY_CARE_PROVIDER_SITE_OTHER): Payer: Self-pay | Admitting: Physician Assistant

## 2016-11-25 ENCOUNTER — Encounter: Payer: Self-pay | Admitting: Physician Assistant

## 2016-11-25 VITALS — BP 134/88 | HR 66 | Ht 75.0 in | Wt 226.0 lb

## 2016-11-25 DIAGNOSIS — I10 Essential (primary) hypertension: Secondary | ICD-10-CM | POA: Insufficient documentation

## 2016-11-25 DIAGNOSIS — R55 Syncope and collapse: Secondary | ICD-10-CM | POA: Insufficient documentation

## 2016-11-25 DIAGNOSIS — R9431 Abnormal electrocardiogram [ECG] [EKG]: Secondary | ICD-10-CM

## 2016-11-25 DIAGNOSIS — Z720 Tobacco use: Secondary | ICD-10-CM | POA: Insufficient documentation

## 2016-11-25 LAB — CBC WITH DIFFERENTIAL/PLATELET
BASOS PCT: 1 %
Basophils Absolute: 0.1 10*3/uL (ref 0.0–0.1)
Eosinophils Absolute: 0.4 10*3/uL (ref 0.0–0.7)
Eosinophils Relative: 3 %
HEMATOCRIT: 45.3 % (ref 39.0–52.0)
HEMOGLOBIN: 15.3 g/dL (ref 13.0–17.0)
LYMPHS ABS: 3.8 10*3/uL (ref 0.7–4.0)
Lymphocytes Relative: 37 %
MCH: 30.8 pg (ref 26.0–34.0)
MCHC: 33.8 g/dL (ref 30.0–36.0)
MCV: 91.1 fL (ref 78.0–100.0)
MONOS PCT: 6 %
Monocytes Absolute: 0.6 10*3/uL (ref 0.1–1.0)
NEUTROS ABS: 5.6 10*3/uL (ref 1.7–7.7)
NEUTROS PCT: 53 %
Platelets: 252 10*3/uL (ref 150–400)
RBC: 4.97 MIL/uL (ref 4.22–5.81)
RDW: 13.2 % (ref 11.5–15.5)
WBC: 10.5 10*3/uL (ref 4.0–10.5)

## 2016-11-25 LAB — COMPREHENSIVE METABOLIC PANEL
ALT: 15 U/L — ABNORMAL LOW (ref 17–63)
ANION GAP: 6 (ref 5–15)
AST: 16 U/L (ref 15–41)
Albumin: 4.2 g/dL (ref 3.5–5.0)
Alkaline Phosphatase: 53 U/L (ref 38–126)
BUN: 9 mg/dL (ref 6–20)
CALCIUM: 8.9 mg/dL (ref 8.9–10.3)
CHLORIDE: 106 mmol/L (ref 101–111)
CO2: 28 mmol/L (ref 22–32)
Creatinine, Ser: 1.14 mg/dL (ref 0.61–1.24)
GFR calc non Af Amer: >60 mL/min (ref >60)
Glucose, Bld: 68 mg/dL (ref 65–99)
Potassium: 3.7 mmol/L (ref 3.5–5.1)
SODIUM: 140 mmol/L (ref 135–145)
Total Bilirubin: 0.8 mg/dL (ref 0.3–1.2)
Total Protein: 7.2 g/dL (ref 6.5–8.1)

## 2016-11-25 LAB — LIPID PANEL
CHOL/HDL RATIO: 3.2 ratio
Cholesterol: 184 mg/dL (ref 0–200)
HDL: 58 mg/dL (ref >40)
LDL Cholesterol: 77 mg/dL (ref 0–99)
TRIGLYCERIDES: 243 mg/dL — AB (ref <150)
VLDL: 49 mg/dL — AB (ref 0–40)

## 2016-11-25 LAB — TSH: TSH: 0.788 u[IU]/mL (ref 0.350–4.500)

## 2016-11-25 LAB — MAGNESIUM: Magnesium: 2.1 mg/dL (ref 1.7–2.4)

## 2016-11-25 NOTE — Patient Instructions (Signed)
Your physician recommends that you schedule a follow-up appointment after test.   Your physician recommends that you return for lab work in: Today  Your physician recommends that you continue on your current medications as directed. Please refer to the Current Medication list given to you today.  Your physician has requested that you have an echocardiogram. Echocardiography is a painless test that uses sound waves to create images of your heart. It provides your doctor with information about the size and shape of your heart and how well your heart's chambers and valves are working. This procedure takes approximately one hour. There are no restrictions for this procedure.  Your physician has recommended that you wear an event monitor. Event monitors are medical devices that record the heart's electrical activity. Doctors most often [place] these monitors to diagnose arrhythmias. Arrhythmias are problems with the speed or rhythm of the heartbeat. The monitor is a small, portable device. You can wear one while you do your normal daily activities. This is usually used to diagnose what is causing palpitations/syncope (passing out).   If you need a refill on your cardiac medications before your next appointment, please call your pharmacy.  Thank you for choosing [facility]!

## 2016-11-25 NOTE — Addendum Note (Signed)
Addended by: Levonne Hubert on: 11/25/2016 03:28 PM   Modules accepted: Orders

## 2016-11-26 ENCOUNTER — Telehealth: Payer: Self-pay | Admitting: *Deleted

## 2016-11-26 NOTE — Telephone Encounter (Signed)
Called patient with test results. No answer. Left message to call back.  

## 2016-11-26 NOTE — Telephone Encounter (Signed)
-----   Message from Charlie Pitter, [place] sent at 11/25/2016  4:49 PM EST ----- Please let patient know labs overall look good. A few minor abnormalities - triglycerides are high. Make sure to be eating a balanced diet, avoid processed sugars or simple carbs, eat whole grain or fruits/veggies and lean proteins instead. LDL is good. CMET looks good. Potassium is slightly below where we typically like to see it but still normal. Please increase dietary intake of healthy sources of dietary intake of potassium including bananas, squash, yogurt, white beans, sweet potatoes, leafy greens, and avocados.  Continue plan as discussed. Dayna Dunn PA-C

## 2016-11-29 ENCOUNTER — Telehealth: Payer: Self-pay | Admitting: *Deleted

## 2016-11-29 NOTE — Telephone Encounter (Signed)
Called patient with test results. No answer. Left message to call back.  

## 2016-11-29 NOTE — Telephone Encounter (Signed)
-----   Message from Charlie Pitter, [place] sent at 11/25/2016  4:49 PM EST ----- Please let patient know labs overall look good. A few minor abnormalities - triglycerides are high. Make sure to be eating a balanced diet, avoid processed sugars or simple carbs, eat whole grain or fruits/veggies and lean proteins instead. LDL is good. CMET looks good. Potassium is slightly below where we typically like to see it but still normal. Please increase dietary intake of healthy sources of dietary intake of potassium including bananas, squash, yogurt, white beans, sweet potatoes, leafy greens, and avocados.  Continue plan as discussed. Dayna Dunn PA-C

## 2016-12-01 ENCOUNTER — Ambulatory Visit (HOSPITAL_COMMUNITY)
Admission: RE | Admit: 2016-12-01 | Discharge: 2016-12-01 | Disposition: A | Discharge status: Home / Self Care | Payer: Self-pay | Source: Ambulatory Visit | Attending: Physician Assistant | Admitting: Physician Assistant

## 2016-12-01 DIAGNOSIS — R55 Syncope and collapse: Secondary | ICD-10-CM | POA: Insufficient documentation

## 2016-12-01 DIAGNOSIS — Z87891 Personal history of nicotine dependence: Secondary | ICD-10-CM | POA: Insufficient documentation

## 2016-12-01 LAB — ECHOCARDIOGRAM COMPLETE
CHL CUP STROKE VOLUME: 64 mL
E decel time: 239 msec
E/e' ratio: 5.39
FS: 35 % (ref 28–44)
IV/PV OW: 1.01
LA diam index: 1.41 cm/m2
LA vol A4C: 54.6 ml
LASIZE: 33 mm
LAVOL: 61.9 mL
LAVOLIN: 26.4 mL/m2
LDCA: 4.52 cm2
LEFT ATRIUM END SYS DIAM: 33 mm
LV E/e'average: 5.39
LV sys vol index: 19 mL/m2
LV sys vol: 44 mL (ref 21–61)
LVDIAVOL: 108 mL (ref 62–150)
LVDIAVOLIN: 46 mL/m2
LVEEMED: 5.39
LVELAT: 14 cm/s
LVOT VTI: 20.2 cm
LVOT diameter: 24 mm
LVOT peak grad rest: 4 mmHg
LVOTPV: 105 cm/s
LVOTSV: 91 mL
Lateral S' vel: 9.57 cm/s
MV Dec: 239
MV pk A vel: 44.9 m/s
MV pk E vel: 75.5 m/s
MVPG: 2 mmHg
PW: 12.9 mm — AB (ref 0.6–1.1)
Simpson's disk: 59
TAPSE: 20.7 mm
TDI e' lateral: 14
TDI e' medial: 8.7

## 2016-12-01 NOTE — Progress Notes (Signed)
*  PRELIMINARY RESULTS* Echocardiogram 2D Echocardiogram has been performed.  Maxwell White 12/01/2016, 2:14 PM

## 2016-12-02 ENCOUNTER — Encounter: Payer: Self-pay | Admitting: *Deleted

## 2016-12-21 ENCOUNTER — Encounter: Payer: Self-pay | Admitting: *Deleted

## 2017-01-06 ENCOUNTER — Encounter: Payer: Self-pay | Admitting: Adult Health

## 2017-01-06 ENCOUNTER — Ambulatory Visit (INDEPENDENT_AMBULATORY_CARE_PROVIDER_SITE_OTHER): Payer: Self-pay | Admitting: Adult Health

## 2017-01-06 ENCOUNTER — Telehealth: Payer: Self-pay | Admitting: Physician Assistant

## 2017-01-06 VITALS — BP 124/80 | HR 72 | Ht 75.0 in | Wt 227.0 lb

## 2017-01-06 DIAGNOSIS — R569 Unspecified convulsions: Secondary | ICD-10-CM

## 2017-01-06 DIAGNOSIS — R55 Syncope and collapse: Secondary | ICD-10-CM

## 2017-01-06 NOTE — Progress Notes (Signed)
Cardiology Office Note   Date:  01/06/2017   ID:  Maxwell White, DOB 11-Jun-1976, MRN [id number]  PCP:  No PCP Per Patient  Cardiologist:  Ross/ Jory Sims, NP   Chief Complaint  Patient presents with  . Dizziness      History of Present Illness: Maxwell White is a 41 y.o. male who presents for ongoing assessment and management of dizziness, with history of near syncope, abnormal EKG, hypertension, and ongoing tobacco abuse. The patient was not allowed to drive until follow-up testing was completed. He was seen on 11/25/2016 by Maxwell Copa, PA and plan for echocardiogram. The patient did have cardiac monitor, was finalized report on 01/02/2017 revealing sinus rhythm, syncope did not correlate with any arrhythmia.  12/01/2016 Left ventricle: The cavity size was normal. Wall thickness was   increased in a pattern of mild LVH. Systolic function was normal.   The estimated ejection fraction was in the range of 55% to 60%.   Wall motion was normal; there were no regional wall motion   abnormalities. Left ventricular diastolic function parameters   were normal. - Right ventricle: Systolic function was mildly reduced.  He states that he was with his children and their mother when he appeared to have a seizure. He reports that he lost consciousness, and was shaking, per witnesses.   Past Medical History:  Diagnosis Date  . Chronic back pain   . Essential hypertension   . Migraine   . Sciatic pain   . Syncope 08/2015  . Tobacco abuse     History reviewed. No pertinent surgical history.   No current outpatient prescriptions on file.   No current facility-administered medications for this visit.     Allergies:   Patient has no known allergies.    Social History:  The patient  reports that he has been smoking Cigarettes.  He started smoking about 20 years ago. He has a 2.00 pack-year smoking history. He has never used smokeless tobacco. He reports that he drinks alcohol.  He reports that he uses drugs, including Marijuana.   Family History:  The patient's family history includes Cancer in his maternal grandmother, maternal uncle, and paternal grandfather; Diabetes in his maternal aunt, maternal grandfather, and maternal grandmother; Heart disease in his mother; Hypertension in his mother; Stroke in his maternal aunt and mother.    ROS: All other systems are reviewed and negative. Unless otherwise mentioned in H&P    PHYSICAL EXAM: VS:  BP 124/80   Pulse 72   Ht 6\' 3"  (1.905 m)   Wt 227 lb (103 kg)   SpO2 98%   BMI 28.37 kg/m  , BMI Body mass index is 28.37 kg/m. GEN: Well nourished, well developed, in no acute distress  HEENT: normal  Neck: no JVD, carotid bruits, or masses Cardiac: RRR; no murmurs, rubs, or gallops,no edema  Respiratory:  clear to auscultation bilaterally, normal work of breathing GI: soft, nontender, nondistended, + BS MS: no deformity or atrophy  Skin: warm and dry, no rash Neuro:  Strength and sensation are intact Psych: euthymic mood, full affect  Recent Labs: 11/25/2016: ALT 15; BUN 9; Creatinine, Ser 1.14; Hemoglobin 15.3; Magnesium 2.1; Platelets 252; Potassium 3.7; Sodium 140; TSH 0.788    Lipid Panel    Component Value Date/Time   CHOL 184 11/25/2016 1540   TRIG 243 (H) 11/25/2016 1540   HDL 58 11/25/2016 1540   CHOLHDL 3.2 11/25/2016 1540   VLDL 49 (H) 11/25/2016 1540  LDLCALC 77 11/25/2016 1540      Wt Readings from Last 3 Encounters:  01/06/17 227 lb (103 kg)  11/25/16 226 lb (102.5 kg)  10/29/15 230 lb (104.3 kg)      ASSESSMENT AND PLAN:  1.  Recurrent dizziness: Cardiac monitor and echocardiogram are unremarkable. He reports seizure like activity with chronic dizziness. He will be referred to [facility] for further evaluation for self reported seizures. He also has a history of migraines. He is advised not to drive until he is fully assessed as to the etiology of his symptoms. He may benefit from  ENT evaluation if neurologic evaluation is negative.    2. Ongoing tobacco abuse; He is advised on smoking cessation for >3 minutes.   He does not have PCP. He is referred to Dr. Meda Coffee, or [facility].    Current medicines are reviewed at length with the patient today.    Labs/ tests ordered today include:  No orders of the defined types were placed in this encounter.    Disposition:   FU with PRN  Signed, Jory Sims, NP  01/06/2017 1:06 PM    [facility] [address] [place], [place] [address] Phone: [phone]; Fax: [phone]

## 2017-01-06 NOTE — Patient Instructions (Signed)
Your physician recommends that you schedule a follow-up appointment As Needed  Your physician recommends that you continue on your current medications as directed. Please refer to the Current Medication list given to you today.  You have been referred to Neurology   You have been given a list of Primary Care Physicians in [facility]   If you need a refill on your cardiac medications before your next appointment, please call your pharmacy.  Thank you for choosing [facility]!

## 2017-01-06 NOTE — Telephone Encounter (Signed)
New Message  ° ° ° °Pt is returning your call for lab results  °

## 2017-01-06 NOTE — Telephone Encounter (Signed)
Returned pts call and discussed his test results. He verbalized understanding.

## 2017-02-23 ENCOUNTER — Telehealth: Payer: Self-pay | Admitting: Neurology

## 2017-02-23 ENCOUNTER — Ambulatory Visit: Payer: Self-pay | Admitting: Neurology

## 2017-02-23 NOTE — Telephone Encounter (Signed)
This patient did not show for a new patient appointment today. 

## 2017-02-24 ENCOUNTER — Encounter: Payer: Self-pay | Admitting: Neurology

## 2017-03-16 ENCOUNTER — Telehealth: Payer: Self-pay | Admitting: Neurology

## 2017-03-16 NOTE — Telephone Encounter (Signed)
Pt was sent a letter due to no show, but it was returned. Now sending a second one.

## 2017-06-16 ENCOUNTER — Emergency Department (HOSPITAL_COMMUNITY)
Admission: EM | Admit: 2017-06-16 | Discharge: 2017-06-16 | Disposition: A | Discharge status: Home / Self Care | Payer: Self-pay | Attending: Emergency Medicine | Admitting: Emergency Medicine

## 2017-06-16 ENCOUNTER — Encounter (HOSPITAL_COMMUNITY): Payer: Self-pay | Admitting: Emergency Medicine

## 2017-06-16 DIAGNOSIS — Z5321 Procedure and treatment not carried out due to patient leaving prior to being seen by health care provider: Secondary | ICD-10-CM | POA: Insufficient documentation

## 2017-06-16 DIAGNOSIS — R55 Syncope and collapse: Secondary | ICD-10-CM | POA: Insufficient documentation

## 2017-06-16 LAB — URINALYSIS, ROUTINE W REFLEX MICROSCOPIC
BILIRUBIN URINE: NEGATIVE
Glucose, UA: NEGATIVE mg/dL
Hgb urine dipstick: NEGATIVE
KETONES UR: NEGATIVE mg/dL
LEUKOCYTES UA: NEGATIVE
NITRITE: NEGATIVE
PROTEIN: NEGATIVE mg/dL
Specific Gravity, Urine: 1.015 (ref 1.005–1.030)
pH: 6 (ref 5.0–8.0)

## 2017-06-16 LAB — BASIC METABOLIC PANEL
ANION GAP: 10 (ref 5–15)
BUN: 11 mg/dL (ref 6–20)
CALCIUM: 9.2 mg/dL (ref 8.9–10.3)
CO2: 21 mmol/L — ABNORMAL LOW (ref 22–32)
Chloride: 106 mmol/L (ref 101–111)
Creatinine, Ser: 1.16 mg/dL (ref 0.61–1.24)
GFR calc Af Amer: >60 mL/min (ref >60)
GLUCOSE: 131 mg/dL — AB (ref 65–99)
Potassium: 3.9 mmol/L (ref 3.5–5.1)
SODIUM: 137 mmol/L (ref 135–145)

## 2017-06-16 LAB — CBC
HEMATOCRIT: 48.4 % (ref 39.0–52.0)
Hemoglobin: 16.4 g/dL (ref 13.0–17.0)
MCH: 31.2 pg (ref 26.0–34.0)
MCHC: 33.9 g/dL (ref 30.0–36.0)
MCV: 92.2 fL (ref 78.0–100.0)
Platelets: 238 10*3/uL (ref 150–400)
RBC: 5.25 MIL/uL (ref 4.22–5.81)
RDW: 13.5 % (ref 11.5–15.5)
WBC: 10.6 10*3/uL — AB (ref 4.0–10.5)

## 2017-06-16 NOTE — ED Notes (Signed)
Did not respond to name being called

## 2017-06-16 NOTE — ED Notes (Signed)
Writer called for room assignment, no response X 2

## 2017-06-16 NOTE — ED Triage Notes (Signed)
Pt states he has been having "passing out spells." Pt also complains of some abd cramping and weight loss. Pt states he has lost 20-30lb in the last 2 months. Pt states he hasn't missed any meals. Pt says at times he feels nauseated. Pt states yesterday he felt like he was going to pass out but did not actually have a syncopal episode. Denies CP/SOB.

## 2017-06-16 NOTE — ED Notes (Signed)
No response to name in waiting room

## 2018-09-21 ENCOUNTER — Encounter: Payer: Self-pay | Admitting: Gastroenterology

## 2018-11-22 ENCOUNTER — Other Ambulatory Visit (HOSPITAL_COMMUNITY): Payer: Self-pay | Admitting: Cardiology

## 2018-11-22 DIAGNOSIS — R079 Chest pain, unspecified: Secondary | ICD-10-CM

## 2018-11-22 DIAGNOSIS — I2089 Other forms of angina pectoris: Secondary | ICD-10-CM

## 2018-11-22 DIAGNOSIS — R9431 Abnormal electrocardiogram [ECG] [EKG]: Secondary | ICD-10-CM

## 2018-11-22 DIAGNOSIS — I208 Other forms of angina pectoris: Secondary | ICD-10-CM

## 2018-11-27 ENCOUNTER — Telehealth (HOSPITAL_COMMUNITY): Payer: Self-pay | Admitting: Emergency Medicine

## 2018-11-27 NOTE — Telephone Encounter (Signed)
Unable to leave voicemail. mychart inactive Encounter complete

## 2018-11-28 ENCOUNTER — Ambulatory Visit (HOSPITAL_COMMUNITY)
Admission: RE | Admit: 2018-11-28 | Discharge: 2018-11-28 | Disposition: A | Discharge status: Home / Self Care | Payer: BLUE CROSS/BLUE SHIELD | Source: Ambulatory Visit | Attending: Cardiology | Admitting: Cardiology

## 2018-11-28 DIAGNOSIS — I208 Other forms of angina pectoris: Secondary | ICD-10-CM | POA: Diagnosis present

## 2018-11-28 DIAGNOSIS — R9431 Abnormal electrocardiogram [ECG] [EKG]: Secondary | ICD-10-CM

## 2018-11-28 DIAGNOSIS — I209 Angina pectoris, unspecified: Secondary | ICD-10-CM

## 2018-11-28 DIAGNOSIS — R079 Chest pain, unspecified: Secondary | ICD-10-CM | POA: Insufficient documentation

## 2018-11-28 MED ORDER — NITROGLYCERIN 0.4 MG SL SUBL
SUBLINGUAL_TABLET | SUBLINGUAL | Status: AC
  Administered 2018-11-28: 0.8 mg
  Filled 2018-11-28: qty 2

## 2018-11-28 MED ORDER — NITROGLYCERIN 0.4 MG SL SUBL
0.8000 mg | SUBLINGUAL_TABLET | Freq: Once | SUBLINGUAL | Status: DC
  Filled 2018-11-28: qty 25

## 2018-11-28 MED ORDER — IOHEXOL 350 MG/ML SOLN
80.0000 mL | Freq: Once | INTRAVENOUS | Status: AC | PRN
  Administered 2018-11-28: 80 mL via INTRAVENOUS

## 2018-11-28 NOTE — Progress Notes (Signed)
CT complete. Patient denies any complaints. Offered patient snack and beverage. 

## 2018-12-05 ENCOUNTER — Other Ambulatory Visit: Payer: Self-pay

## 2018-12-05 ENCOUNTER — Encounter: Payer: Self-pay | Admitting: Gastroenterology

## 2018-12-05 ENCOUNTER — Encounter

## 2018-12-05 ENCOUNTER — Telehealth: Payer: Self-pay

## 2018-12-05 ENCOUNTER — Ambulatory Visit (INDEPENDENT_AMBULATORY_CARE_PROVIDER_SITE_OTHER): Payer: BLUE CROSS/BLUE SHIELD | Admitting: Gastroenterology

## 2018-12-05 DIAGNOSIS — K625 Hemorrhage of anus and rectum: Secondary | ICD-10-CM

## 2018-12-05 MED ORDER — PEG 3350-KCL-NA BICARB-NACL 420 G PO SOLR: 4000.0000 mL | ORAL | 0 refills | Status: DC

## 2018-12-05 NOTE — Telephone Encounter (Signed)
Tried to call pt to inform of pre-op appt 12/20/18 at 1:45pm, LMOVM. Letter mailed.

## 2018-12-05 NOTE — Assessment & Plan Note (Signed)
Pleasant 43 year old male presenting with chronic rectal bleeding and symptoms consistent with internal hemorrhoids. No prior colonoscopy. No first-degree relatives with colon cancer or colon polyps. Discussed diagnostic colonoscopy in near future and likely outpatient hemorrhoid banding. He is agreeable to this and desires outpatient banding if appropriate.   Proceed with TCS with Dr. Gala Romney in near future: the risks, benefits, and alternatives have been discussed with the patient in detail. The patient states understanding and desires to proceed. Propofol due to medication and Propofol Return in outpatient follow-up for possible hemorrhoid banding Supplemental fiber daily Avoid prolonged sitting on toilet

## 2018-12-05 NOTE — Patient Instructions (Signed)
We have arranged a colonoscopy with Dr. Gala Romney in the near future.   I recommend adding Benefiber or generic equivalent daily, starting with 1 tablespoon each day and increase to three times a day as tolerated. I have included a handout.  We will likely arrange hemorrhoid banding after colonoscopy: we will schedule an outpatient office visit to assess after the colonoscopy.  It was a pleasure to see you today. I strive to create trusting relationships with patients to provide genuine, compassionate, and quality care. I value your feedback. If you receive a survey regarding your visit,  I greatly appreciate you taking time to fill this out.   Annitta Needs, PhD, ANP-BC [facility]

## 2018-12-05 NOTE — Progress Notes (Signed)
Primary Care Physician:  Wyatt Haste, NP Primary Gastroenterologist:  Dr. Gala Romney   Chief Complaint  Patient presents with  . Hemorrhoids    bleeding, itching, burning, soilage    HPI:   Maxwell White is a 43 y.o. male presenting today at the request of Rosealee Albee, NP, secondary to chronic rectal bleeding.    He notes a many year history of rectal bleeding. Treats with OTC agents at home. No constipation. Occasional pain but rarely. Has burning, itching, soilage. Using suppositories, witch hazel. Sometimes abdominal discomfort, cramping, associated with BM. No straining. BM every day. Cousin in his 52s with colon cancer and passed away. No first degree relatives with colon cancer or polyps. Has prolapsing hemorrhoids, has to push back in. Prolonged sitting on toilet.   Past Medical History:  Diagnosis Date  . Bipolar disorder ([place])   . Chronic back pain   . Essential hypertension   . Migraine   . Sciatic pain   . Syncope 08/2015  . Tobacco abuse     Past Surgical History:  Procedure Laterality Date  . None      Current Outpatient Medications  Medication Sig Dispense Refill  . ARIPiprazole (ABILIFY) 2 MG tablet Take 2 mg by mouth daily.     No current facility-administered medications for this visit.     Allergies as of 12/05/2018  . (No Known Allergies)    Family History  Problem Relation Age of Onset  . Hypertension Mother   . Heart disease Mother   . Stroke Mother   . Diabetes Maternal Aunt   . Stroke Maternal Aunt   . Cancer Maternal Uncle   . Diabetes Maternal Grandmother   . Cancer Maternal Grandmother   . Diabetes Maternal Grandfather   . Cancer Paternal Grandfather     Social History   Socioeconomic History  . Marital status: Single    Spouse name: Not on file  . Number of children: Not on file  . Years of education: Not on file  . Highest education level: Not on file  Occupational History  . Occupation: [employer]  .  Financial resource strain: Not on file  . Food insecurity:    Worry: Not on file    Inability: Not on file  . Transportation needs:    Medical: Not on file    Non-medical: Not on file  Tobacco Use  . Smoking status: Current Some Day Smoker    Packs/day: 0.50    Years: 4.00    Pack years: 2.00    Types: Cigarettes    Start date: 09/20/1996  . Smokeless tobacco: Never Used  Substance and Sexual Activity  . Alcohol use: Yes    Alcohol/week: 0.0 standard drinks    Comment: occ  . Drug use: Yes    Types: Marijuana    Comment: daily  . Sexual activity: Not on file  Lifestyle  . Physical activity:    Days per week: Not on file    Minutes per session: Not on file  . Stress: Not on file  Relationships  . Social connections:    Talks on phone: Not on file    Gets together: Not on file    Attends religious service: Not on file    Active member of club or organization: Not on file    Attends meetings of clubs or organizations: Not on file    Relationship status: Not on file  . Intimate partner violence:  Fear of current or ex partner: Not on file    Emotionally abused: Not on file    Physically abused: Not on file    Forced sexual activity: Not on file  Other Topics Concern  . Not on file  Social History Narrative  . Not on file    Review of Systems: Gen: Denies any fever, chills, fatigue, weight loss, lack of appetite.  CV: Denies chest pain, heart palpitations, peripheral edema, syncope.  Resp: Denies shortness of breath at rest or with exertion. Denies wheezing or cough.  GI: see HPI GU : Denies urinary burning, urinary frequency, urinary hesitancy MS: Denies joint pain, muscle weakness, cramps, or limitation of movement.  Derm: Denies rash, itching, dry skin Psych: Denies depression, anxiety, memory loss, and confusion Heme: see HPI  Physical Exam: BP (!) 137/95   Pulse 66   Temp 98.1 F (36.7 C) (Oral)   Ht 6\' 3"  (1.905 m)   Wt 219 lb 12.8 oz (99.7 kg)   BMI  27.47 kg/m  General:   Alert and oriented. Pleasant and cooperative. Well-nourished and well-developed.  Head:  Normocephalic and atraumatic. Eyes:  Without icterus, sclera clear and conjunctiva pink.  Ears:  Normal auditory acuity. Nose:  No deformity, discharge,  or lesions. Mouth:  No deformity or lesions, oral mucosa pink.  Lungs:  Clear to auscultation bilaterally. No wheezes, rales, or rhonchi. No distress.  Heart:  S1, S2 present without murmurs appreciated.  Abdomen:  +BS, soft, non-tender and non-distended. No HSM noted. No guarding or rebound. No masses appreciated.  Rectal:  Deferred till colonoscopy Msk:  Symmetrical without gross deformities. Normal posture. Extremities:  Without  edema. Neurologic:  Alert and  oriented x4;  grossly normal neurologically. Psych:  Alert and cooperative. Normal mood and affect.

## 2018-12-06 NOTE — Progress Notes (Signed)
cc'd to pcp 

## 2018-12-19 ENCOUNTER — Encounter (HOSPITAL_COMMUNITY): Payer: Self-pay

## 2018-12-19 NOTE — Patient Instructions (Addendum)
Maxwell White  12/19/2018     @PREFPERIOPPHARMACY @   Your procedure is scheduled on 12/25/2018.  Report to [facility] at 8:45 A.M.  Call this number if you have problems the morning of surgery:  [phone]   Remember:  Do not eat or drink after midnight.  Follow the instructions given to you by Dr. Roseanne Kaufman office    Take these medicines the morning of surgery with A SIP OF WATER abilify and amlodipine    Do not wear jewelry, make-up or nail polish.  Do not wear lotions, powders, or perfumes, or deodorant.  Do not shave 48 hours prior to surgery.  Men may shave face and neck.  Do not bring valuables to the hospital.  [facility] is not responsible for any belongings or valuables.  Contacts, dentures or bridgework may not be worn into surgery.  Leave your suitcase in the car.  After surgery it may be brought to your room.  For patients admitted to the hospital, discharge time will be determined by your treatment team.  Patients discharged the day of surgery will not be allowed to drive home.   Name and phone number of your driver:   family Special instructions:  n/a  Please read over the following fact sheets that you were given. Care and Recovery After Surgery  Colonoscopy, Adult A colonoscopy is an exam to look at the entire large intestine. During the exam, a lubricated, flexible tube that has a camera on the end of it is inserted into the anus and then passed into the rectum, colon, and other parts of the large intestine. You may have a colonoscopy as a part of normal colorectal screening or if you have certain symptoms, such as:  Lack of red blood cells (anemia).  Diarrhea that does not go away.  Abdominal pain.  Blood in your stool (feces). A colonoscopy can help screen for and diagnose medical problems, including:  Tumors.  Polyps.  Inflammation.  Areas of bleeding. Tell a health care provider about:  Any allergies you have.  All medicines you are  taking, including vitamins, herbs, eye drops, creams, and over-the-counter medicines.  Any problems you or family members have had with anesthetic medicines.  Any blood disorders you have.  Any surgeries you have had.  Any medical conditions you have.  Any problems you have had passing stool. What are the risks? Generally, this is a safe procedure. However, problems may occur, including:  Bleeding.  A tear in the intestine.  A reaction to medicines given during the exam.  Infection (rare). What happens before the procedure? Eating and drinking restrictions Follow instructions from your health care provider about eating and drinking, which may include:  A few days before the procedure - follow a low-fiber diet. Avoid nuts, seeds, dried fruit, raw fruits, and vegetables.  1-3 days before the procedure - follow a clear liquid diet. Drink only clear liquids, such as clear broth or bouillon, black coffee or tea, clear juice, clear soft drinks or sports drinks, gelatin dessert, and popsicles. Avoid any liquids that contain red or purple dye.  On the day of the procedure - do not eat or drink anything starting 2 hours before the procedure, or within the time period that your health care provider recommends. Up to 2 hours before the procedure, you may continue to drink clear liquids, such as water or clear fruit juice. Bowel prep If you were prescribed an oral bowel prep to clean out your  colon:  Take it as told by your health care provider. Starting the day before your procedure, you will need to drink a large amount of medicated liquid. The liquid will cause you to have multiple loose stools until your stool is almost clear or light green.  If your skin or anus gets irritated from diarrhea, you may use these to relieve the irritation: ? Medicated wipes, such as adult wet wipes with aloe and vitamin E. ? A skin-soothing product like petroleum jelly.  If you vomit while drinking the  bowel prep, take a break for up to 60 minutes and then begin the bowel prep again. If vomiting continues and you cannot take the bowel prep without vomiting, call your health care provider.  To clean out your colon, you may also be given: ? Laxative medicines. ? Instructions about how to use an enema. General instructions  Ask your health care provider about: ? Changing or stopping your regular medicines or supplements. This is especially important if you are taking iron supplements, diabetes medicines, or blood thinners. ? Taking medicines such as aspirin and ibuprofen. These medicines can thin your blood. Do not take these medicines before the procedure if your health care provider tells you not to.  Plan to have someone take you home from the hospital or clinic. What happens during the procedure?   An IV may be inserted into one of your veins.  You will be given medicine to help you relax (sedative).  To reduce your risk of infection: ? Your health care team will wash or sanitize their hands. ? Your anal area will be washed with soap.  You will be asked to lie on your side with your knees bent.  Your health care provider will lubricate a long, thin, flexible tube. The tube will have a camera and a light on the end.  The tube will be inserted into your anus.  The tube will be gently eased through your rectum and colon.  Air will be delivered into your colon to keep it open. You may feel some pressure or cramping.  The camera will be used to take images during the procedure.  A small tissue sample may be removed to be examined under a microscope (biopsy).  If small polyps are found, your health care provider may remove them and have them checked for cancer cells.  When the exam is done, the tube will be removed. The procedure may vary among health care providers and hospitals. What happens after the procedure?  Your blood pressure, heart rate, breathing rate, and blood  oxygen level will be monitored until the medicines you were given have worn off.  Do not drive for 24 hours after the exam.  You may have a small amount of blood in your stool.  You may pass gas and have mild abdominal cramping or bloating due to the air that was used to inflate your colon during the exam.  It is up to you to get the results of your procedure. Ask your health care provider, or the department performing the procedure, when your results will be ready. Summary  A colonoscopy is an exam to look at the entire large intestine.  During a colonoscopy, a lubricated, flexible tube with a camera on the end of it is inserted into the anus and then passed into the colon and other parts of the large intestine.  Follow instructions from your health care provider about eating and drinking before the procedure.  If  you were prescribed an oral bowel prep to clean out your colon, take it as told by your health care provider.  After your procedure, your blood pressure, heart rate, breathing rate, and blood oxygen level will be monitored until the medicines you were given have worn off. This information is not intended to replace advice given to you by your health care provider. Make sure you discuss any questions you have with your health care provider. Document Released: 09/03/2000 Document Revised: 06/29/2017 Document Reviewed: 11/18/2015 Elsevier Interactive Patient Education  2019 [employer].

## 2018-12-20 ENCOUNTER — Encounter (HOSPITAL_COMMUNITY): Payer: Self-pay

## 2018-12-20 ENCOUNTER — Other Ambulatory Visit: Payer: Self-pay

## 2018-12-20 ENCOUNTER — Encounter (HOSPITAL_COMMUNITY)
Admission: RE | Admit: 2018-12-20 | Discharge: 2018-12-20 | Disposition: A | Discharge status: Home / Self Care | Payer: BLUE CROSS/BLUE SHIELD | Source: Ambulatory Visit | Attending: Internal Medicine | Admitting: Internal Medicine

## 2018-12-21 ENCOUNTER — Telehealth: Payer: Self-pay

## 2018-12-21 NOTE — Telephone Encounter (Signed)
Endo scheduler wanted to move TCS for 12/25/18 up to 7:30am. Tried to call pt, male answered phone. No one listed on DPR. Asked her to have him call office before 4:45pm today.

## 2018-12-21 NOTE — Telephone Encounter (Signed)
Pt called office, TCS moved up to 7:30am. Advised him to arrive at 6:15am 12/25/18 and to start drinking 2nd half of prep that morning at 2:30am. NPO after 4:30am. Verbalized understanding. LMOVM for endo scheduler.

## 2018-12-22 ENCOUNTER — Telehealth: Payer: Self-pay | Admitting: Internal Medicine

## 2018-12-22 NOTE — Telephone Encounter (Signed)
done

## 2018-12-22 NOTE — Telephone Encounter (Signed)
I spoke with the patient and made him aware that we would be rescheduling his procedure on Monday ands someone from our office will call him with a new date and time.

## 2018-12-22 NOTE — Telephone Encounter (Signed)
I tried to call the patient and left a message for a returned call back to my mobile number

## 2018-12-22 NOTE — Telephone Encounter (Signed)
Patient set up for a diagnostic colonoscopy on April 6.  Patient gives a history of chronic rectal bleeding "for years".   Dr. Erling Cruz, Chief medical office for [name]" called me and directed me to cancel all elective procedures next week due to the COVID-19 viral pandemic.  Per his direction, this elective procedure will be canceled and rescheduled at a later date.

## 2018-12-25 NOTE — Telephone Encounter (Signed)
Called pt, TCS w/Propofol w/RMR rescheduled to 02/01/19 at 1:45pm. Informed endo scheduler. Pre-op phone called scheduled for 01/26/19. Called pt back and informed him of pre-op phone call. New instructions mailed.

## 2019-01-26 ENCOUNTER — Other Ambulatory Visit: Payer: Self-pay

## 2019-01-26 ENCOUNTER — Encounter (HOSPITAL_COMMUNITY): Payer: Self-pay | Admitting: *Deleted

## 2019-01-26 ENCOUNTER — Encounter (HOSPITAL_COMMUNITY)
Admission: RE | Admit: 2019-01-26 | Discharge: 2019-01-26 | Disposition: A | Discharge status: Home / Self Care | Payer: Self-pay | Source: Ambulatory Visit | Attending: Internal Medicine | Admitting: Internal Medicine

## 2019-01-30 ENCOUNTER — Telehealth: Payer: Self-pay | Admitting: *Deleted

## 2019-01-30 NOTE — Telephone Encounter (Signed)
Called patient. Made aware for procedure on 5/14 we need to move up to 7:30am that day. He was fine with this. He is aware to arrive at 6:15am. On day of procedure 5/14 he will drink 2nd half of prep at 2:30am and has until 4:30am, npo after 4:30am. He voiced understanding. Melanie in endo is aware

## 2019-02-01 ENCOUNTER — Other Ambulatory Visit: Payer: Self-pay

## 2019-02-01 ENCOUNTER — Encounter (HOSPITAL_COMMUNITY)
Admission: RE | Disposition: A | Discharge status: Home / Self Care | Payer: Self-pay | Source: Home / Self Care | Attending: Internal Medicine

## 2019-02-01 ENCOUNTER — Ambulatory Visit (HOSPITAL_COMMUNITY): Payer: Self-pay | Admitting: Anesthesiology

## 2019-02-01 ENCOUNTER — Telehealth: Payer: Self-pay | Admitting: Cardiology

## 2019-02-01 ENCOUNTER — Encounter (HOSPITAL_COMMUNITY): Payer: Self-pay | Admitting: Emergency Medicine

## 2019-02-01 ENCOUNTER — Ambulatory Visit (HOSPITAL_COMMUNITY)
Admission: RE | Admit: 2019-02-01 | Discharge: 2019-02-01 | Disposition: A | Discharge status: Home / Self Care | Payer: Self-pay | Attending: Internal Medicine | Admitting: Internal Medicine

## 2019-02-01 DIAGNOSIS — M549 Dorsalgia, unspecified: Secondary | ICD-10-CM | POA: Insufficient documentation

## 2019-02-01 DIAGNOSIS — F17210 Nicotine dependence, cigarettes, uncomplicated: Secondary | ICD-10-CM | POA: Insufficient documentation

## 2019-02-01 DIAGNOSIS — K644 Residual hemorrhoidal skin tags: Secondary | ICD-10-CM | POA: Insufficient documentation

## 2019-02-01 DIAGNOSIS — Z833 Family history of diabetes mellitus: Secondary | ICD-10-CM | POA: Insufficient documentation

## 2019-02-01 DIAGNOSIS — I10 Essential (primary) hypertension: Secondary | ICD-10-CM | POA: Insufficient documentation

## 2019-02-01 DIAGNOSIS — D122 Benign neoplasm of ascending colon: Secondary | ICD-10-CM | POA: Insufficient documentation

## 2019-02-01 DIAGNOSIS — Z823 Family history of stroke: Secondary | ICD-10-CM | POA: Insufficient documentation

## 2019-02-01 DIAGNOSIS — K625 Hemorrhage of anus and rectum: Secondary | ICD-10-CM

## 2019-02-01 DIAGNOSIS — F319 Bipolar disorder, unspecified: Secondary | ICD-10-CM | POA: Insufficient documentation

## 2019-02-01 DIAGNOSIS — G43909 Migraine, unspecified, not intractable, without status migrainosus: Secondary | ICD-10-CM | POA: Insufficient documentation

## 2019-02-01 DIAGNOSIS — Z809 Family history of malignant neoplasm, unspecified: Secondary | ICD-10-CM | POA: Insufficient documentation

## 2019-02-01 DIAGNOSIS — Z8249 Family history of ischemic heart disease and other diseases of the circulatory system: Secondary | ICD-10-CM | POA: Insufficient documentation

## 2019-02-01 DIAGNOSIS — K921 Melena: Secondary | ICD-10-CM | POA: Insufficient documentation

## 2019-02-01 DIAGNOSIS — D124 Benign neoplasm of descending colon: Secondary | ICD-10-CM | POA: Insufficient documentation

## 2019-02-01 DIAGNOSIS — K641 Second degree hemorrhoids: Secondary | ICD-10-CM | POA: Insufficient documentation

## 2019-02-01 DIAGNOSIS — G8929 Other chronic pain: Secondary | ICD-10-CM | POA: Insufficient documentation

## 2019-02-01 DIAGNOSIS — Z79899 Other long term (current) drug therapy: Secondary | ICD-10-CM | POA: Insufficient documentation

## 2019-02-01 HISTORY — PX: POLYPECTOMY: SHX5525

## 2019-02-01 HISTORY — PX: COLONOSCOPY WITH PROPOFOL: SHX5780

## 2019-02-01 LAB — BASIC METABOLIC PANEL
Anion gap: 6 (ref 5–15)
BUN: 7 mg/dL (ref 6–20)
CO2: 25 mmol/L (ref 22–32)
Calcium: 8.3 mg/dL — ABNORMAL LOW (ref 8.9–10.3)
Chloride: 110 mmol/L (ref 98–111)
Creatinine, Ser: 1.12 mg/dL (ref 0.61–1.24)
GFR calc Af Amer: >60 mL/min (ref >60)
GFR calc non Af Amer: >60 mL/min (ref >60)
Glucose, Bld: 88 mg/dL (ref 70–99)
Potassium: 3.8 mmol/L (ref 3.5–5.1)
Sodium: 141 mmol/L (ref 135–145)

## 2019-02-01 LAB — GLUCOSE, CAPILLARY: Glucose-Capillary: 116 mg/dL — ABNORMAL HIGH (ref 70–99)

## 2019-02-01 SURGERY — COLONOSCOPY WITH PROPOFOL
Anesthesia: Monitor Anesthesia Care

## 2019-02-01 MED ORDER — KETAMINE HCL 50 MG/5ML IJ SOSY
PREFILLED_SYRINGE | INTRAMUSCULAR | Status: AC
  Filled 2019-02-01: qty 5

## 2019-02-01 MED ORDER — GLYCOPYRROLATE 0.2 MG/ML IJ SOLN
INTRAMUSCULAR | Status: DC | PRN
  Administered 2019-02-01: 0.2 mg via INTRAVENOUS

## 2019-02-01 MED ORDER — LIDOCAINE HCL (CARDIAC) PF 100 MG/5ML IV SOSY
PREFILLED_SYRINGE | INTRAVENOUS | Status: DC | PRN
  Administered 2019-02-01: 40 mg via INTRAVENOUS

## 2019-02-01 MED ORDER — CHLORHEXIDINE GLUCONATE CLOTH 2 % EX PADS: 6.0000 | MEDICATED_PAD | Freq: Once | CUTANEOUS | Status: DC

## 2019-02-01 MED ORDER — KETAMINE HCL 10 MG/ML IJ SOLN
INTRAMUSCULAR | Status: DC | PRN
  Administered 2019-02-01 (×2): 10 mg via INTRAVENOUS

## 2019-02-01 MED ORDER — PROPOFOL 10 MG/ML IV BOLUS
INTRAVENOUS | Status: DC | PRN
  Administered 2019-02-01: 20 mg via INTRAVENOUS

## 2019-02-01 MED ORDER — PROPOFOL 10 MG/ML IV BOLUS
INTRAVENOUS | Status: AC
  Filled 2019-02-01: qty 40

## 2019-02-01 MED ORDER — PROPOFOL 500 MG/50ML IV EMUL
INTRAVENOUS | Status: DC | PRN
  Administered 2019-02-01: 150 ug/kg/min via INTRAVENOUS

## 2019-02-01 MED ORDER — LACTATED RINGERS IV SOLN
INTRAVENOUS | Status: DC
  Administered 2019-02-01: 07:00:00 via INTRAVENOUS

## 2019-02-01 NOTE — Anesthesia Postprocedure Evaluation (Signed)
Anesthesia Post Note  Patient: Maxwell White  Procedure(s) Performed: COLONOSCOPY WITH PROPOFOL (N/A ) POLYPECTOMY  Patient location during evaluation: PACU Anesthesia Type: MAC Level of consciousness: awake and alert and oriented Pain management: pain level controlled Vital Signs Assessment: post-procedure vital signs reviewed and stable Respiratory status: spontaneous breathing Cardiovascular status: stable Postop Assessment: no apparent nausea or vomiting Anesthetic complications: no     Last Vitals:  Vitals:   02/01/19 0633 02/01/19 0759  BP: 120/85 (P) 120/85  Pulse: 79   Resp: 17 (P) 16  Temp: 36.8 C (P) 36.4 C  SpO2: 96%     Last Pain:  Vitals:   02/01/19 0732  TempSrc:   PainSc: 0-No pain                 Dejion Grillo A

## 2019-02-01 NOTE — Anesthesia Preprocedure Evaluation (Signed)
Anesthesia Evaluation  Patient identified by MRN, date of birth, ID band Patient awake    Reviewed: Allergy & Precautions, H&P , NPO status , Patient's Chart, lab work & pertinent test results  Airway Mallampati: II  TM Distance: >3 FB Neck ROM: full    Dental no notable dental hx. (+) Loose,    Pulmonary neg pulmonary ROS, Current Smoker,    Pulmonary exam normal breath sounds clear to auscultation       Cardiovascular Exercise Tolerance: Good hypertension, negative cardio ROS   Rhythm:regular Rate:Normal     Neuro/Psych  Headaches,  Neuromuscular disease negative psych ROS   GI/Hepatic Neg liver ROS, Rectal bleeding   Endo/Other  negative endocrine ROS  Renal/GU negative Renal ROS  negative genitourinary   Musculoskeletal   Abdominal   Peds  Hematology negative hematology ROS (+)   Anesthesia Other Findings   Reproductive/Obstetrics negative OB ROS                             Anesthesia Physical Anesthesia Plan  ASA: II  Anesthesia Plan: MAC   Post-op Pain Management:    Induction:   PONV Risk Score and Plan:   Airway Management Planned:   Additional Equipment:   Intra-op Plan:   Post-operative Plan:   Informed Consent: I have reviewed the patients History and Physical, chart, labs and discussed the procedure including the risks, benefits and alternatives for the proposed anesthesia with the patient or authorized representative who has indicated his/her understanding and acceptance.     Dental Advisory Given  Plan Discussed with: CRNA  Anesthesia Plan Comments:         Anesthesia Quick Evaluation

## 2019-02-01 NOTE — Progress Notes (Signed)
Per dr Gala Romney, do BMET after procedure

## 2019-02-01 NOTE — Discharge Instructions (Signed)
Colonoscopy Discharge Instructions  Read the instructions outlined below and refer to this sheet in the next few weeks. These discharge instructions provide you with general information on caring for yourself after you leave the hospital. Your doctor may also give you specific instructions. While your treatment has been planned according to the most current medical practices available, unavoidable complications occasionally occur. If you have any problems or questions after discharge, call Dr. Gala Romney at [phone]. ACTIVITY  You may resume your regular activity, but move at a slower pace for the next 24 hours.   Take frequent rest periods for the next 24 hours.   Walking will help get rid of the air and reduce the bloated feeling in your belly (abdomen).   No driving for 24 hours (because of the medicine (anesthesia) used during the test).    Do not sign any important legal documents or operate any machinery for 24 hours (because of the anesthesia used during the test).  NUTRITION  Drink plenty of fluids.   You may resume your normal diet as instructed by your doctor.   Begin with a light meal and progress to your normal diet. Heavy or fried foods are harder to digest and may make you feel sick to your stomach (nauseated).   Avoid alcoholic beverages for 24 hours or as instructed.  MEDICATIONS  You may resume your normal medications unless your doctor tells you otherwise.  WHAT YOU CAN EXPECT TODAY  Some feelings of bloating in the abdomen.   Passage of more gas than usual.   Spotting of blood in your stool or on the toilet paper.  IF YOU HAD POLYPS REMOVED DURING THE COLONOSCOPY:  No aspirin products for 7 days or as instructed.   No alcohol for 7 days or as instructed.   Eat a soft diet for the next 24 hours.  FINDING OUT THE RESULTS OF YOUR TEST Not all test results are available during your visit. If your test results are not back during the visit, make an appointment  with your caregiver to find out the results. Do not assume everything is normal if you have not heard from your caregiver or the medical facility. It is important for you to follow up on all of your test results.  SEEK IMMEDIATE MEDICAL ATTENTION IF:  You have more than a spotting of blood in your stool.   Your belly is swollen (abdominal distention).   You are nauseated or vomiting.   You have a temperature over 101.   You have abdominal pain or discomfort that is severe or gets worse throughout the day.    Hemorrhoids Hemorrhoids are swollen veins in and around the rectum or anus. There are two types of hemorrhoids:  Internal hemorrhoids. These occur in the veins that are just inside the rectum. They may poke through to the outside and become irritated and painful.  External hemorrhoids. These occur in the veins that are outside the anus and can be felt as a painful swelling or hard lump near the anus. Most hemorrhoids do not cause serious problems, and they can be managed with home treatments such as diet and lifestyle changes. If home treatments do not help the symptoms, procedures can be done to shrink or remove the hemorrhoids. What are the causes? This condition is caused by increased pressure in the anal area. This pressure may result from various things, including:  Constipation.  Straining to have a bowel movement.  Diarrhea.  Pregnancy.  Obesity.  Sitting  for long periods of time.  Heavy lifting or other activity that causes you to strain.  Anal sex.  Riding a bike for a long period of time. What are the signs or symptoms? Symptoms of this condition include:  Pain.  Anal itching or irritation.  Rectal bleeding.  Leakage of stool (feces).  Anal swelling.  One or more lumps around the anus. How is this diagnosed? This condition can often be diagnosed through a visual exam. Other exams or tests may also be done, such as:  An exam that involves feeling  the rectal area with a gloved hand (digital rectal exam).  An exam of the anal canal that is done using a small tube (anoscope).  A blood test, if you have lost a significant amount of blood.  A test to look inside the colon using a flexible tube with a camera on the end (sigmoidoscopy or colonoscopy). How is this treated? This condition can usually be treated at home. However, various procedures may be done if dietary changes, lifestyle changes, and other home treatments do not help your symptoms. These procedures can help make the hemorrhoids smaller or remove them completely. Some of these procedures involve surgery, and others do not. Common procedures include:  Rubber band ligation. Rubber bands are placed at the base of the hemorrhoids to cut off their blood supply.  Sclerotherapy. Medicine is injected into the hemorrhoids to shrink them.  Infrared coagulation. A type of light energy is used to get rid of the hemorrhoids.  Hemorrhoidectomy surgery. The hemorrhoids are surgically removed, and the veins that supply them are tied off.  Stapled hemorrhoidopexy surgery. The surgeon staples the base of the hemorrhoid to the rectal wall. Follow these instructions at home: Eating and drinking   Eat foods that have a lot of fiber in them, such as whole grains, beans, nuts, fruits, and vegetables.  Ask your health care provider about taking products that have added fiber (fiber supplements).  Reduce the amount of fat in your diet. You can do this by eating low-fat dairy products, eating less red meat, and avoiding processed foods.  Drink enough fluid to keep your urine pale yellow. Managing pain and swelling   Take warm sitz baths for 20 minutes, 3-4 times a day to ease pain and discomfort. You may do this in a bathtub or using a portable sitz bath that fits over the toilet.  If directed, apply ice to the affected area. Using ice packs between sitz baths may be helpful. ? Put ice in a  plastic bag. ? Place a towel between your skin and the bag. ? Leave the ice on for 20 minutes, 2-3 times a day. General instructions  Take over-the-counter and prescription medicines only as told by your health care provider.  Use medicated creams or suppositories as told.  Get regular exercise. Ask your health care provider how much and what kind of exercise is best for you. In general, you should do moderate exercise for at least 30 minutes on most days of the week (150 minutes each week). This can include activities such as walking, biking, or yoga.  Go to the bathroom when you have the urge to have a bowel movement. Do not wait.  Avoid straining to have bowel movements.  Keep the anal area dry and clean. Use wet toilet paper or moist towelettes after a bowel movement.  Do not sit on the toilet for long periods of time. This increases blood pooling and pain.  Keep all follow-up visits as told by your health care provider. This is important. Contact a health care provider if you have:  Increasing pain and swelling that are not controlled by treatment or medicine.  Difficulty having a bowel movement, or you are unable to have a bowel movement.  Pain or inflammation outside the area of the hemorrhoids. Get help right away if you have:  Uncontrolled bleeding from your rectum. Summary  Hemorrhoids are swollen veins in and around the rectum or anus.  Most hemorrhoids can be managed with home treatments such as diet and lifestyle changes.  Taking warm sitz baths can help ease pain and discomfort.  In severe cases, procedures or surgery can be done to shrink or remove the hemorrhoids. This information is not intended to replace advice given to you by your health care provider. Make sure you discuss any questions you have with your health care provider. Document Released: 09/03/2000 Document Revised: 01/26/2018 Document Reviewed: 01/26/2018 Elsevier Interactive Patient Education   2019 [place].  Colon Polyps  Polyps are tissue growths inside the body. Polyps can grow in many places, including the large intestine (colon). A polyp may be a round bump or a mushroom-shaped growth. You could have one polyp or several. Most colon polyps are noncancerous (benign). However, some colon polyps can become cancerous over time. Finding and removing the polyps early can help prevent this. What are the causes? The exact cause of colon polyps is not known. What increases the risk? You are more likely to develop this condition if you:  Have a family history of colon cancer or colon polyps.  Are older than 25 or older than 45 if you are African American.  Have inflammatory bowel disease, such as ulcerative colitis or Crohn's disease.  Have certain hereditary conditions, such as: ? Familial adenomatous polyposis. ? Lynch syndrome. ? Turcot syndrome. ? Peutz-Jeghers syndrome.  Are overweight.  Smoke cigarettes.  Do not get enough exercise.  Drink too much alcohol.  Eat a diet that is high in fat and red meat and low in fiber.  Had childhood cancer that was treated with abdominal radiation. What are the signs or symptoms? Most polyps do not cause symptoms. If you have symptoms, they may include:  Blood coming from your rectum when having a bowel movement.  Blood in your stool. The stool may look dark red or black.  Abdominal pain.  A change in bowel habits, such as constipation or diarrhea. How is this diagnosed? This condition is diagnosed with a colonoscopy. This is a procedure in which a lighted, flexible scope is inserted into the anus and then passed into the colon to examine the area. Polyps are sometimes found when a colonoscopy is done as part of routine cancer screening tests. How is this treated? Treatment for this condition involves removing any polyps that are found. Most polyps can be removed during a colonoscopy. Those polyps will then be tested  for cancer. Additional treatment may be needed depending on the results of testing. Follow these instructions at home: Lifestyle  Maintain a healthy weight, or lose weight if recommended by your health care provider.  Exercise every day or as told by your health care provider.  Do not use any products that contain nicotine or tobacco, such as cigarettes and e-cigarettes. If you need help quitting, ask your health care provider.  If you drink alcohol, limit how much you have: ? 0-1 drink a day for women. ? 0-2 drinks a day  for men.  Be aware of how much alcohol is in your drink. In the [place] one drink equals one 12 oz bottle of beer (355 mL), one 5 oz glass of wine (148 mL), or one 1 oz shot of hard liquor (44 mL). Eating and drinking   Eat foods that are high in fiber, such as fruits, vegetables, and whole grains.  Eat foods that are high in calcium and vitamin D, such as milk, cheese, yogurt, eggs, liver, fish, and broccoli.  Limit foods that are high in fat, such as fried foods and desserts.  Limit the amount of red meat and processed meat you eat, such as hot dogs, sausage, bacon, and lunch meats. General instructions  Keep all follow-up visits as told by your health care provider. This is important. ? This includes having regularly scheduled colonoscopies. ? Talk to your health care provider about when you need a colonoscopy. Contact a health care provider if:  You have new or worsening bleeding during a bowel movement.  You have new or increased blood in your stool.  You have a change in bowel habits.  You lose weight for no known reason. Summary  Polyps are tissue growths inside the body. Polyps can grow in many places, including the colon.  Most colon polyps are noncancerous (benign), but some can become cancerous over time.  This condition is diagnosed with a colonoscopy.  Treatment for this condition involves removing any polyps that are found. Most polyps can  be removed during a colonoscopy. This information is not intended to replace advice given to you by your health care provider. Make sure you discuss any questions you have with your health care provider. Document Released: 06/02/2004 Document Revised: 12/22/2017 Document Reviewed: 12/22/2017 Elsevier Interactive Patient Education  2019 [place] and colon polyp information provided  Pamphlet on hemorrhoid banding provided  Office visit with Roseanne Kaufman in 6 to 8 weeks for hemorrhoid banding  Further recommendations to follow pending review of pathology report

## 2019-02-01 NOTE — H&P (Signed)
@LOGO @   Primary Care Physician:  Wyatt Haste, NP Primary Gastroenterologist:  Dr. Gala Romney  Pre-Procedure History & Physical: HPI:  Maxwell White is a 43 y.o. male here for for further evaluation of rectal bleeding via colonoscopy.  He has intermittently protruding hemorrhoids.  Hemorrhoid banding candidate depending on the findings from today's examination.  Past Medical History:  Diagnosis Date  . Bipolar disorder ([place])   . Chronic back pain   . Essential hypertension   . Migraine   . Sciatic pain   . Syncope 08/2015  . Tobacco abuse     Past Surgical History:  Procedure Laterality Date  . None      Prior to Admission medications   Medication Sig Start Date End Date Taking? Authorizing Provider  amLODipine (NORVASC) 5 MG tablet Take 5 mg by mouth daily.   Yes [provider]  ARIPiprazole (ABILIFY) 2 MG tablet Take 2 mg by mouth daily.   Yes [provider]  polyethylene glycol-electrolytes (TRILYTE) 420 g solution Take 4,000 mLs by mouth as directed. 12/05/18  Yes Daneil Dolin, MD    Allergies as of 12/05/2018  . (No Known Allergies)    Family History  Problem Relation Age of Onset  . Hypertension Mother   . Heart disease Mother   . Stroke Mother   . Diabetes Maternal Aunt   . Stroke Maternal Aunt   . Cancer Maternal Uncle   . Diabetes Maternal Grandmother   . Cancer Maternal Grandmother   . Diabetes Maternal Grandfather   . Cancer Paternal Grandfather     Social History   Socioeconomic History  . Marital status: Single    Spouse name: Not on file  . Number of children: Not on file  . Years of education: Not on file  . Highest education level: Not on file  Occupational History  . Occupation: [employer]  . Financial resource strain: Not on file  . Food insecurity:    Worry: Not on file    Inability: Not on file  . Transportation needs:    Medical: Not on file    Non-medical: Not on file  Tobacco Use  . Smoking  status: Current Some Day Smoker    Packs/day: 0.50    Years: 4.00    Pack years: 2.00    Types: Cigarettes    Start date: 09/20/1996  . Smokeless tobacco: Never Used  Substance and Sexual Activity  . Alcohol use: Yes    Alcohol/week: 0.0 standard drinks    Comment: occ  . Drug use: Yes    Types: Marijuana    Comment: daily  . Sexual activity: Not on file  Lifestyle  . Physical activity:    Days per week: Not on file    Minutes per session: Not on file  . Stress: Not on file  Relationships  . Social connections:    Talks on phone: Not on file    Gets together: Not on file    Attends religious service: Not on file    Active member of club or organization: Not on file    Attends meetings of clubs or organizations: Not on file    Relationship status: Not on file  . Intimate partner violence:    Fear of current or ex partner: Not on file    Emotionally abused: Not on file    Physically abused: Not on file    Forced sexual activity: Not on file  Other Topics Concern  .  Not on file  Social History Narrative  . Not on file    Review of Systems: See HPI, otherwise negative ROS  Physical Exam: BP 120/85   Pulse 79   Temp 98.3 F (36.8 C) (Oral)   Resp 17   SpO2 96%  General:   Alert,  Well-developed, well-nourished, pleasant and cooperative in NAD Neck:  Supple; no masses or thyromegaly. No significant cervical adenopathy. Lungs:  Clear throughout to auscultation.   No wheezes, crackles, or rhonchi. No acute distress. Heart:  Regular rate and rhythm; no murmurs, clicks, rubs,  or gallops. Abdomen: Non-distended, normal bowel sounds.  Soft and nontender without appreciable mass or hepatosplenomegaly.  Pulses:  Normal pulses noted. Extremities:  Without clubbing or edema.  Impression/Plan: Pleasant 43 year old gentleman with intermittent rectal bleeding history of protruding "hemorrhoids".  Colonoscopy now being done to further evaluate rectal bleeding per plan.  The risks,  benefits, limitations, alternatives and imponderables have been reviewed with the patient. Questions have been answered. All parties are agreeable.      Notice: This dictation was prepared with Dragon dictation along with smaller phrase technology. Any transcriptional errors that result from this process are unintentional and may not be corrected upon review.

## 2019-02-01 NOTE — Anesthesia Procedure Notes (Signed)
Procedure Name: MAC Date/Time: 02/01/2019 7:29 AM Performed by: Andree Elk Aristidis Talerico A, CRNA Pre-anesthesia Checklist: Patient identified, Emergency Drugs available, Suction available, Timeout performed and Patient being monitored Patient Re-evaluated:Patient Re-evaluated prior to induction Oxygen Delivery Method: Non-rebreather mask

## 2019-02-01 NOTE — Telephone Encounter (Signed)
Virtual Visit Pre-Appointment Phone Call  "(Name), I am calling you today to discuss your upcoming appointment. We are currently trying to limit exposure to the virus that causes COVID-19 by seeing patients at home rather than in the office."  "What is the BEST phone number to call the day of the visit?" - [phone]  1. Do you have or have access to (through a family member/friend) a smartphone with video capability that we can use for your visit?" a. If yes - list this number in appt notes as cell (if different from BEST phone #) and list the appointment type as a VIDEO visit in appointment notes b. If no - list the appointment type as a PHONE visit in appointment notes  2. Confirm consent - "In the setting of the current Covid19 crisis, you are scheduled for a (phone or video) visit with your provider on (date) at (time).  Just as we do with many in-office visits, in order for you to participate in this visit, we must obtain consent.  If you'd like, I can send this to your mychart (if signed up) or email for you to review.  Otherwise, I can obtain your verbal consent now.  All virtual visits are billed to your insurance company just like a normal visit would be.  By agreeing to a virtual visit, we'd like you to understand that the technology does not allow for your provider to perform an examination, and thus may limit your provider's ability to fully assess your condition. If your provider identifies any concerns that need to be evaluated in person, we will make arrangements to do so.  Finally, though the technology is pretty good, we cannot assure that it will always work on either your or our end, and in the setting of a video visit, we may have to convert it to a phone-only visit.  In either situation, we cannot ensure that we have a secure connection.  Are you willing to proceed?" STAFF: Did the patient verbally acknowledge consent to telehealth visit? Document YES/NO here: YES    3. Advise patient to be prepared - "Two hours prior to your appointment, go ahead and check your blood pressure, pulse, oxygen saturation, and your weight (if you have the equipment to check those) and write them all down. When your visit starts, your provider will ask you for this information. If you have an Apple Watch or Kardia device, please plan to have heart rate information ready on the day of your appointment. Please have a pen and paper handy nearby the day of the visit as well."  4. Give patient instructions for MyChart download to smartphone OR Doximity/Doxy.me as below if video visit (depending on what platform provider is using)  5. Inform patient they will receive a phone call 15 minutes prior to their appointment time (may be from unknown caller ID) so they should be prepared to answer    TELEPHONE CALL NOTE  Maxwell White has been deemed a candidate for a follow-up tele-health visit to limit community exposure during the Covid-19 pandemic. I spoke with the patient via phone to ensure availability of phone/video source, confirm preferred email & phone number, and discuss instructions and expectations.  I reminded Maxwell White to be prepared with any vital sign and/or heart rhythm information that could potentially be obtained via home monitoring, at the time of his visit. I reminded Maxwell White to expect a phone call prior to his visit.  Vicky  T Slaughter 02/01/2019 2:18 PM   INSTRUCTIONS FOR DOWNLOADING THE MYCHART APP TO SMARTPHONE  - The patient must first make sure to have activated MyChart and know their login information - If Apple, go to [employer] and type in MyChart in the search bar and download the app. If Android, ask patient to go to [employer] and type in [place] in the search bar and download the app. The app is free but as with any other app downloads, their phone may require them to verify saved payment information or Apple/Android password.  -  The patient will need to then log into the app with their MyChart username and password, and select [facility] as their healthcare provider to link the account. When it is time for your visit, go to the MyChart app, find appointments, and click Begin Video Visit. Be sure to Select Allow for your device to access the Microphone and Camera for your visit. You will then be connected, and your provider will be with you shortly.  **If they have any issues connecting, or need assistance please contact MyChart service desk [phone])**  **If using a computer, in order to ensure the best quality for their visit they will need to use either of the following Internet Browsers: [employer], or Google Chrome**  IF USING DOXIMITY or DOXY.ME - The patient will receive a link just prior to their visit by text.     FULL LENGTH CONSENT FOR TELE-HEALTH VISIT   I hereby voluntarily request, consent and authorize [facility] and its employed or contracted physicians, physician assistants, nurse practitioners or other licensed health care professionals (the Practitioner), to provide me with telemedicine health care services (the Services") as deemed necessary by the treating Practitioner. I acknowledge and consent to receive the Services by the Practitioner via telemedicine. I understand that the telemedicine visit will involve communicating with the Practitioner through live audiovisual communication technology and the disclosure of certain medical information by electronic transmission. I acknowledge that I have been given the opportunity to request an in-person assessment or other available alternative prior to the telemedicine visit and am voluntarily participating in the telemedicine visit.  I understand that I have the right to withhold or withdraw my consent to the use of telemedicine in the course of my care at any time, without affecting my right to future care or treatment, and that the  Practitioner or I may terminate the telemedicine visit at any time. I understand that I have the right to inspect all information obtained and/or recorded in the course of the telemedicine visit and may receive copies of available information for a reasonable fee.  I understand that some of the potential risks of receiving the Services via telemedicine include:   Delay or interruption in medical evaluation due to technological equipment failure or disruption;  Information transmitted may not be sufficient (e.g. poor resolution of images) to allow for appropriate medical decision making by the Practitioner; and/or   In rare instances, security protocols could fail, causing a breach of personal health information.  Furthermore, I acknowledge that it is my responsibility to provide information about my medical history, conditions and care that is complete and accurate to the best of my ability. I acknowledge that Practitioner's advice, recommendations, and/or decision may be based on factors not within their control, such as incomplete or inaccurate data provided by me or distortions of diagnostic images or specimens that may result from electronic transmissions. I understand that the practice  of medicine is not an Chief Strategy Officer and that Practitioner makes no warranties or guarantees regarding treatment outcomes. I acknowledge that I will receive a copy of this consent concurrently upon execution via email to the email address I last provided but may also request a printed copy by calling the office of [facility].    I understand that my insurance will be billed for this visit.   I have read or had this consent read to me.  I understand the contents of this consent, which adequately explains the benefits and risks of the Services being provided via telemedicine.   I have been provided ample opportunity to ask questions regarding this consent and the Services and have had my questions answered to my  satisfaction.  I give my informed consent for the services to be provided through the use of telemedicine in my medical care  By participating in this telemedicine visit I agree to the above.

## 2019-02-01 NOTE — Op Note (Signed)
[facility] Patient Name: Maxwell White Procedure Date: 02/01/2019 7:14 AM MRN: [id number] Date of Birth: 1976-03-10 Attending MD: Norvel Richards , MD CSN: [id number] Age: 43 Admit Type: Outpatient Procedure:                Colonoscopy Indications:              Hematochezia Providers:                Norvel Richards, MD, Jeanann Lewandowsky. Sharon Seller, RN,                            Raphael Gibney, Technician, Aram Candela Referring MD:              Medicines:                Propofol per Anesthesia Complications:            No immediate complications. Estimated Blood Loss:     Estimated blood loss was minimal. Procedure:                Pre-Anesthesia Assessment:                           - Prior to the procedure, a History and Physical                            was performed, and patient medications and                            allergies were reviewed. The patient's tolerance of                            previous anesthesia was also reviewed. The risks                            and benefits of the procedure and the sedation                            options and risks were discussed with the patient.                            All questions were answered, and informed consent                            was obtained. Prior Anticoagulants: The patient has                            taken no previous anticoagulant or antiplatelet                            agents. ASA Grade Assessment: III - A patient with                            severe systemic disease. After reviewing the risks  and benefits, the patient was deemed in                            satisfactory condition to undergo the procedure.                           After obtaining informed consent, the colonoscope                            was passed under direct vision. Throughout the                            procedure, the patient's blood pressure, pulse, and   oxygen saturations were monitored continuously. The                            CF-[id number] ([id number]) scope was introduced through                            the and advanced to the the cecum, identified by                            appendiceal orifice and ileocecal valve. The                            colonoscopy was performed without difficulty. The                            patient tolerated the procedure well. The quality                            of the bowel preparation was adequate. The                            ileocecal valve, appendiceal orifice, and rectum                            were photographed. The entire colon was well                            visualized. Scope In: 6:44:03 AM Scope Out: 7:51:45 AM Total Procedure Duration: 0 hours 15 minutes 6 seconds  Findings:      The perianal and digital rectal examinations were normal.      Non-bleeding external and internal hemorrhoids were found during       retroflexion. The hemorrhoids were moderate, medium-sized and Grade II       (internal hemorrhoids that prolapse but reduce spontaneously).      Two sessile polyps were found in the descending colon and ascending       colon.      The exam was otherwise without abnormality on direct and retroflexion       views. The polyps were 5 to 8 mm in size. These polyps were removed with       a cold snare. Resection and retrieval were complete. Estimated blood  loss was minimal. Impression:               - Non-bleeding external and internal hemorrhoids.                           - Two 5 to 8 mm polyps in the descending colon and                            in the ascending colon, removed with a cold snare.                            Resected and retrieved.                           - The examination was otherwise normal on direct                            and retroflexion views. Moderate Sedation:      Moderate (conscious) sedation was personally administered by an        anesthesia professional. The following parameters were monitored: oxygen       saturation, heart rate, blood pressure, respiratory rate, EKG, adequacy       of pulmonary ventilation, and response to care. Recommendation:           - Patient has a contact number available for                            emergencies. The signs and symptoms of potential                            delayed complications were discussed with the                            patient. Return to normal activities tomorrow.                            Written discharge instructions were provided to the                            patient.                           - Advance diet as tolerated.                           - Continue present medications.                           - Repeat colonoscopy date to be determined after                            pending pathology results are reviewed for                            surveillance based on pathology results.                           -  Return to GI office in 6 weeks for hemorrhoid                            banding Procedure Code(s):        --- Professional ---                           [phone], Colonoscopy, flexible; with removal of                            tumor(s), polyp(s), or other lesion(s) by snare                            technique Diagnosis Code(s):        --- Professional ---                           K63.5, Polyp of colon                           K64.1, Second degree hemorrhoids                           K92.1, Melena (includes Hematochezia) CPT copyright 2019 American Medical Association. All rights reserved. The codes documented in this report are preliminary and upon coder review may  be revised to meet current compliance requirements. Cristopher Estimable. Rourk, MD Norvel Richards, MD 02/01/2019 8:01:06 AM This report has been signed electronically. Number of Addenda: 0

## 2019-02-01 NOTE — Transfer of Care (Signed)
Immediate Anesthesia Transfer of Care Note  Patient: Maxwell White  Procedure(s) Performed: COLONOSCOPY WITH PROPOFOL (N/A ) POLYPECTOMY  Patient Location: PACU  Anesthesia Type:MAC  Level of Consciousness: awake, alert , oriented and patient cooperative  Airway & Oxygen Therapy: Patient Spontanous Breathing  Post-op Assessment: Report given to RN and Post -op Vital signs reviewed and stable  Post vital signs: Reviewed and stable  Last Vitals:  Vitals Value Taken Time  BP 120/85 02/01/2019  7:58 AM  Temp    Pulse 56 02/01/2019  8:01 AM  Resp 7 02/01/2019  8:01 AM  SpO2 100 % 02/01/2019  8:01 AM  Vitals shown include unvalidated device data.  Last Pain:  Vitals:   02/01/19 0732  TempSrc:   PainSc: 0-No pain         Complications: No apparent anesthesia complications

## 2019-02-02 ENCOUNTER — Encounter: Payer: Self-pay | Admitting: Cardiology

## 2019-02-02 ENCOUNTER — Encounter (HOSPITAL_COMMUNITY): Payer: Self-pay | Admitting: Internal Medicine

## 2019-02-03 ENCOUNTER — Encounter: Payer: Self-pay | Admitting: Internal Medicine

## 2019-02-06 ENCOUNTER — Ambulatory Visit: Payer: Self-pay | Admitting: Cardiology

## 2019-02-08 ENCOUNTER — Encounter: Payer: Self-pay | Admitting: Cardiology

## 2019-02-08 ENCOUNTER — Other Ambulatory Visit: Payer: Self-pay

## 2019-02-08 ENCOUNTER — Telehealth (INDEPENDENT_AMBULATORY_CARE_PROVIDER_SITE_OTHER): Payer: Self-pay | Admitting: Cardiology

## 2019-02-08 VITALS — BP 132/102 | Ht 75.0 in | Wt 228.0 lb

## 2019-02-08 DIAGNOSIS — IMO0001 Reserved for inherently not codable concepts without codable children: Secondary | ICD-10-CM

## 2019-02-08 DIAGNOSIS — I10 Essential (primary) hypertension: Secondary | ICD-10-CM

## 2019-02-08 DIAGNOSIS — F319 Bipolar disorder, unspecified: Secondary | ICD-10-CM

## 2019-02-08 DIAGNOSIS — R001 Bradycardia, unspecified: Secondary | ICD-10-CM | POA: Insufficient documentation

## 2019-02-08 DIAGNOSIS — Z87898 Personal history of other specified conditions: Secondary | ICD-10-CM

## 2019-02-08 DIAGNOSIS — Z720 Tobacco use: Secondary | ICD-10-CM

## 2019-02-08 MED ORDER — LOSARTAN POTASSIUM 25 MG PO TABS: 25.0000 mg | ORAL_TABLET | Freq: Every day | ORAL | 11 refills | Status: DC

## 2019-02-08 NOTE — Progress Notes (Signed)
Virtual Visit via Video Note   This visit type was conducted due to national recommendations for restrictions regarding the COVID-19 Pandemic (e.g. social distancing) in an effort to limit this patient's exposure and mitigate transmission in our community.  Due to his co-morbid illnesses, this patient is at least at moderate risk for complications without adequate follow up.  This format is felt to be most appropriate for this patient at this time.  All issues noted in this document were discussed and addressed.  A limited physical exam was performed with this format.  Please refer to the patient's chart for his consent to telehealth for [facility].   Date:  02/08/2019   ID:  Maxwell White, DOB August 10, 1976, MRN [id number]  Patient Location: Home Provider Location: Home  PCP:  Wyatt Haste, NP  Cardiologist:  Linna Hoff Electrophysiologist:  None   Evaluation Performed:  Follow-Up Visit  Chief Complaint:  dizziness  History of Present Illness:    Maxwell White is a 43 y.o. male with a history of syncope and collapse in December 2016.  The etiology was not clear then, it was felt this may have been orthostatic.  He has been seen periodically since.  He has complained of dizziness at times.  He had an abnormal EKG on an office visit in March 2018 and an echocardiogram and event monitor were ordered.  The echocardiogram showed an ejection fraction of 55 to 60% with mild LVH.  The event monitor was unremarkable.  Since we saw him last the patient has been placed on amlodipine 5 mg a day for hypertension.  He has been seen in the emergency room at [facility] twice in the last 2 months for a "strange feeling in my chest".  While in the emergency room there he was told his heart rate had dropped into the 40s.  They said that may mean something, or it may be normal for him.  A coronary CT was ordered after 1 of those visits, this was done November 28, 2018 and showed no coronary disease  and a 0 calcium score.  Recently the patient had a colonoscopy.  During the colonoscopy it was noted that his heart rate dropped, he was asked to contact his cardiologist.  I spoke with the patient and his wife today via video visit.  The patient has not had frank syncope.  He does have dizzy spells at times.  His wife also mentioned that when he goes to sleep he tends to have "spasms".  He does not snore or have daytime fatigue.  The patient does not have symptoms concerning for COVID-19 infection (fever, chills, cough, or new shortness of breath).    Past Medical History:  Diagnosis Date  . Bipolar disorder ([place])   . Chronic back pain   . Essential hypertension   . Migraine   . Sciatic pain   . Syncope 08/2015  . Tobacco abuse    Past Surgical History:  Procedure Laterality Date  . COLONOSCOPY WITH PROPOFOL N/A 02/01/2019   Procedure: COLONOSCOPY WITH PROPOFOL;  Surgeon: Daneil Dolin, MD;  Location: [facility] ENDO [facility];  Service: Endoscopy;  Laterality: N/A;  12:00pm - office called and gave pt new arrival time  . None    . POLYPECTOMY  02/01/2019   Procedure: POLYPECTOMY;  Surgeon: Daneil Dolin, MD;  Location: [facility];  Service: Endoscopy;;  colon     Current Meds  Medication Sig  . amLODipine (NORVASC) 5 MG tablet  Take 5 mg by mouth daily.  . ARIPiprazole (ABILIFY) 2 MG tablet Take 2 mg by mouth daily.     Allergies:   Patient has no known allergies.   Social History   Tobacco Use  . Smoking status: Current Some Day Smoker    Packs/day: 0.50    Years: 4.00    Pack years: 2.00    Types: Cigarettes    Start date: 09/20/1996  . Smokeless tobacco: Never Used  Substance Use Topics  . Alcohol use: Yes    Alcohol/week: 0.0 standard drinks    Comment: occ  . Drug use: Yes    Types: Marijuana    Comment: daily     Family Hx: The patient's family history includes Cancer in his maternal grandmother, maternal uncle, and paternal grandfather; Diabetes in his maternal  aunt, maternal grandfather, and maternal grandmother; Heart disease in his mother; Hypertension in his mother; Stroke in his maternal aunt and mother.  ROS:   Please see the history of present illness.    Pt c/o ED which he related to Amlodipine All other systems reviewed and are negative.   Prior CV studies:   The following studies were reviewed today:   Labs/Other Tests and Data Reviewed:    EKG:  An ECG dated 06/18/2019 was personally reviewed today and demonstrated:  NSR, incomplete RBBB  Recent Labs: 02/01/2019: BUN 7; Creatinine, Ser 1.12; Potassium 3.8; Sodium 141   Recent Lipid Panel Lab Results  Component Value Date/Time   CHOL 184 11/25/2016 03:40 PM   TRIG 243 (H) 11/25/2016 03:40 PM   HDL 58 11/25/2016 03:40 PM   CHOLHDL 3.2 11/25/2016 03:40 PM   LDLCALC 77 11/25/2016 03:40 PM    Wt Readings from Last 3 Encounters:  02/08/19 228 lb (103.4 kg)  12/05/18 219 lb 12.8 oz (99.7 kg)  06/16/17 219 lb (99.3 kg)     Objective:    Vital Signs:  BP (!) 132/102 Comment: has not taking meds today. does not take daily  Ht 6\' 3"  (1.905 m)   Wt 228 lb (103.4 kg)   BMI 28.50 kg/m    VITAL SIGNS:  reviewed  Pt was in no acute distress during the video visit  ASSESSMENT & PLAN:    Dizziness- ? Secondary to transient bradycardia  Normal coronaries- Negative CTA 11/28/2018  HTN- Pt would like to try going off Amlodipine.  He will monitor his B/P at home- he says its usually in the 120/80 range, even when he doesn't take his Amlodipine.   COVID-19 Education: The signs and symptoms of COVID-19 were discussed with the patient and how to seek care for testing (follow up with PCP or arrange E-visit).  The importance of social distancing was discussed today.  Time:   Today, I have spent 22 minutes with the patient with telehealth technology discussing the above problems.     Medication Adjustments/Labs and Tests Ordered: Current medicines are reviewed at length with the  patient today.  Concerns regarding medicines are outlined above.   Tests Ordered: No orders of the defined types were placed in this encounter.   Medication Changes: No orders of the defined types were placed in this encounter.   Disposition:  Follow up I suggested a two week event monitor.  I discussed sleep apnea with the patient and his wife and they do not feel that is the issue.  I did call in an Rx for Losartan 25 mg.  He will start this if he notices  his B/P creeping up off Amlodipine. F/U virtual visit in two weeks.   Signed, Kerin Ransom, PA-C  02/08/2019 1:21 PM    [facility]

## 2019-02-08 NOTE — Patient Instructions (Signed)
Medication Instructions:  Your physician has recommended you make the following change in your medication:   Stop Taking Amlodipine   Start Losartan 25 mg Daily    If you need a refill on your cardiac medications before your next appointment, please call your pharmacy.   Lab work: NONE  If you have labs (blood work) drawn today and your tests are completely normal, you will receive your results only by: [facility] MyChart Message (if you have MyChart) OR . A paper copy in the mail If you have any lab test that is abnormal or we need to change your treatment, we will call you to review the results.  Testing/Procedures: Your physician has recommended that you wear an event monitor. Event monitors are medical devices that record the heart's electrical activity. Doctors most often [place] these monitors to diagnose arrhythmias. Arrhythmias are problems with the speed or rhythm of the heartbeat. The monitor is a small, portable device. You can wear one while you do your normal daily activities. This is usually used to diagnose what is causing palpitations/syncope (passing out).    Follow-Up: At [facility], you and your health needs are our priority.  As part of our continuing mission to provide you with exceptional heart care, we have created designated Provider Care Teams.  These Care Teams include your primary Cardiologist (physician) and Advanced Practice Providers (APPs -  Physician Assistants and Nurse Practitioners) who all work together to provide you with the care you need, when you need it. You will need a follow up appointment in 2 weeks.  Please call our office 2 months in advance to schedule this appointment.  You may see No primary care provider on file. or one of the following Advanced Practice Providers on your designated Care Team:   [place], PA-C [facility]) . Ermalinda Barrios, PA-C ([place])  Any Other Special Instructions Will Be Listed Below (If Applicable).  Thank you for choosing [facility]!

## 2019-02-14 ENCOUNTER — Ambulatory Visit (INDEPENDENT_AMBULATORY_CARE_PROVIDER_SITE_OTHER): Payer: Self-pay

## 2019-02-14 DIAGNOSIS — R001 Bradycardia, unspecified: Secondary | ICD-10-CM

## 2019-02-14 DIAGNOSIS — Z87898 Personal history of other specified conditions: Secondary | ICD-10-CM

## 2019-02-15 ENCOUNTER — Other Ambulatory Visit: Payer: Self-pay

## 2019-02-22 ENCOUNTER — Other Ambulatory Visit: Payer: Self-pay

## 2019-02-22 ENCOUNTER — Telehealth: Payer: Self-pay | Admitting: Student

## 2019-03-07 ENCOUNTER — Encounter: Payer: Self-pay | Admitting: Cardiology

## 2019-03-07 ENCOUNTER — Other Ambulatory Visit: Payer: Self-pay

## 2019-03-07 ENCOUNTER — Telehealth (INDEPENDENT_AMBULATORY_CARE_PROVIDER_SITE_OTHER): Payer: Self-pay | Admitting: Cardiology

## 2019-03-07 VITALS — BP 130/80 | Ht 75.0 in | Wt 231.0 lb

## 2019-03-07 DIAGNOSIS — Z87898 Personal history of other specified conditions: Secondary | ICD-10-CM

## 2019-03-07 DIAGNOSIS — Z720 Tobacco use: Secondary | ICD-10-CM

## 2019-03-07 DIAGNOSIS — IMO0001 Reserved for inherently not codable concepts without codable children: Secondary | ICD-10-CM

## 2019-03-07 DIAGNOSIS — I10 Essential (primary) hypertension: Secondary | ICD-10-CM

## 2019-03-07 DIAGNOSIS — R001 Bradycardia, unspecified: Secondary | ICD-10-CM

## 2019-03-07 NOTE — Progress Notes (Signed)
Virtual Visit via Telephone Note   This visit type was conducted due to national recommendations for restrictions regarding the COVID-19 Pandemic (e.g. social distancing) in an effort to limit this patient's exposure and mitigate transmission in our community.  Due to his co-morbid illnesses, this patient is at least at moderate risk for complications without adequate follow up.  This format is felt to be most appropriate for this patient at this time.  The patient did not have access to video technology/had technical difficulties with video requiring transitioning to audio format only (telephone).  All issues noted in this document were discussed and addressed.  No physical exam could be performed with this format.  Please refer to the patient's chart for his  consent to telehealth for [facility].   Date:  03/07/2019   ID:  Barnetta Chapel Herrman, DOB December 16, 1975, MRN [id number]  Patient Location: Home Provider Location: Office  PCP:  Wyatt Haste, NP  Cardiologist:  Dr Harrington Challenger Electrophysiologist:  None   Evaluation Performed:  Follow-Up Visit  Chief Complaint:  none  History of Present Illness:    RONDA KAZMI is a 43 y.o. male with a history of syncope and collapse in December 2016.  The etiology was not clear then, it was felt this may have been orthostatic.  He has been seen periodically since.  He has complained of dizziness at times.  He had an abnormal EKG on an office visit in March 2018 and an echocardiogram and event monitor were ordered.  The echocardiogram showed an ejection fraction of 55 to 60% with mild LVH.  The event monitor was unremarkable.   He has been seen in the emergency room at [facility] twice in the last 2 months for a "strange feeling in my chest".  While in the emergency room there he was told his heart rate had dropped into the 40s.  They said that may mean something, or it may be normal for him.  A coronary CT was ordered after 1 of those visits, this was  done November 28, 2018 and showed no coronary disease and a 0 calcium score.  Recently the patient had a colonoscopy.  During the colonoscopy it was noted that his heart rate dropped, he was asked to contact his cardiologist. I saw him in a virtual visit 02/08/2019.  A two week event monitor was placed.  This revealed no arrhythmia.  The patient was contacted by phone today for follow up.   He says he has not had any syncope or near syncope.  His wife wanted to know why he "shakes sometimes".  I suggested they follow up with his PCP regarding that but reassured them him monitor was normal.  If he does have a syncopal spell he'll need a loop recorder placed.   The patient does not have symptoms concerning for COVID-19 infection (fever, chills, cough, or new shortness of breath).    Past Medical History:  Diagnosis Date  . Bipolar disorder ([place])   . Chronic back pain   . Essential hypertension   . Migraine   . Sciatic pain   . Syncope 08/2015  . Tobacco abuse    Past Surgical History:  Procedure Laterality Date  . COLONOSCOPY WITH PROPOFOL N/A 02/01/2019   Procedure: COLONOSCOPY WITH PROPOFOL;  Surgeon: Daneil Dolin, MD;  [facility];  Service: Endoscopy;  Laterality: N/A;  12:00pm - office called and gave pt new arrival time  . None    .  POLYPECTOMY  02/01/2019   Procedure: POLYPECTOMY;  Surgeon: Daneil Dolin, MD;  Location[facility];  Service: Endoscopy;;  colon     Current Meds  Medication Sig  . losartan (COZAAR) 25 MG tablet Take 1 tablet (25 mg total) by mouth daily.     Allergies:   Patient has no known allergies.   Social History   Tobacco Use  . Smoking status: Current Some Day Smoker    Packs/day: 0.50    Years: 4.00    Pack years: 2.00    Types: Cigarettes    Start date: 09/20/1996  . Smokeless tobacco: Never Used  Substance Use Topics  . Alcohol use: Yes    Alcohol/week: 0.0 standard drinks    Comment: occ  . Drug use: Yes    Types: Marijuana     Comment: daily     Family Hx: The patient's family history includes Cancer in his maternal grandmother, maternal uncle, and paternal grandfather; Diabetes in his maternal aunt, maternal grandfather, and maternal grandmother; Heart disease in his mother; Hypertension in his mother; Stroke in his maternal aunt and mother.  ROS:   Please see the history of present illness.    All other systems reviewed and are negative.   Prior CV studies:   The following studies were reviewed today: Event monitor 02/15/2019-   Labs/Other Tests and Data Reviewed:    EKG:  No ECG reviewed.  Recent Labs: 02/01/2019: BUN 7; Creatinine, Ser 1.12; Potassium 3.8; Sodium 141   Recent Lipid Panel Lab Results  Component Value Date/Time   CHOL 184 11/25/2016 03:40 PM   TRIG 243 (H) 11/25/2016 03:40 PM   HDL 58 11/25/2016 03:40 PM   CHOLHDL 3.2 11/25/2016 03:40 PM   LDLCALC 77 11/25/2016 03:40 PM    Wt Readings from Last 3 Encounters:  03/07/19 231 lb (104.8 kg)  02/08/19 228 lb (103.4 kg)  12/05/18 219 lb 12.8 oz (99.7 kg)     Objective:    Vital Signs:  Ht 6\' 3"  (1.905 m)   Wt 231 lb (104.8 kg)   BMI 28.87 kg/m    VITAL SIGNS:  reviewed He says his B/P was 130/80 last week ASSESSMENT & PLAN:    H/O remote syncope and recent bradycardia- No arrhythmia seen on two week event monitor.  HTN- Pt requested OK to stop Amlodipine at his LOV.  I changed him to Losartan 25 mg.   Normal coronaries- Negative CTA 11/28/2018  COVID-19 Education: The signs and symptoms of COVID-19 were discussed with the patient and how to seek care for testing (follow up with PCP or arrange E-visit).  The importance of social distancing was discussed today.  Time:   Today, I have spent 15 minutes with the patient with telehealth technology discussing the above problems.     Medication Adjustments/Labs and Tests Ordered: Current medicines are reviewed at length with the patient today.  Concerns regarding  medicines are outlined above.   Tests Ordered: No orders of the defined types were placed in this encounter.   Medication Changes: No orders of the defined types were placed in this encounter.   Follow Up:  In Person 6 months with Dr Harrington Challenger or her APP.  The patient knows to contact us if he has syncope, if so he will need a loop recorder placed.   Signed, Kerin Ransom, PA-C  03/07/2019 1:08 PM    [facility]

## 2019-03-07 NOTE — Patient Instructions (Signed)
Medication Instructions: Your physician recommends that you continue on your current medications as directed. Please refer to the Current Medication list given to you today.   Labwork: none  Procedures/Testing: none  Follow-Up: 6 months with the Physician's Assistant   Any Additional Special Instructions Will Be Listed Below (If Applicable).     If you need a refill on your cardiac medications before your next appointment, please call your pharmacy.      Thank you for choosing [facility] !

## 2019-10-04 NOTE — Progress Notes (Deleted)
Cardiology Office Note   Date:  10/04/2019   ID:  [name], DOB Jan 12, 1976, MRN [id number]  PCP:  Wyatt Haste, NP  Cardiologist:   Dorris Carnes, MD       History of Present Illness: Maxwell White is a 44 y.o. male with a history of syncope and collapse Possibly de to orthostaasis.  Echo showed LVEF 55 to 60% Event monitor unremarkable Had funny feeling in chest in March 2020   Seen in [facility] ED   Coronary CT ordered    Calcium score was 0  Pt had colonoscopye done and HR dropped  Event monitior negatvie   Pt seen by B Strader in June 2020.        No outpatient medications have been marked as taking for the 10/05/19 encounter (Appointment) with Fay Records, MD.     Allergies:   Patient has no known allergies.   Past Medical History:  Diagnosis Date  . Bipolar disorder ([place])   . Chronic back pain   . Essential hypertension   . Migraine   . Sciatic pain   . Syncope 08/2015  . Tobacco abuse     Past Surgical History:  Procedure Laterality Date  . COLONOSCOPY WITH PROPOFOL N/A 02/01/2019   Procedure: COLONOSCOPY WITH PROPOFOL;  Surgeon: Daneil Dolin, MD;  [facility];  Service: Endoscopy;  Laterality: N/A;  12:00pm - office called and gave pt new arrival time  . None    . POLYPECTOMY  02/01/2019   Procedure: POLYPECTOMY;  Surgeon: Daneil Dolin, MD;  [facility];  Service: Endoscopy;;  colon     Social History:  The patient  reports that he has been smoking cigarettes. He started smoking about 23 years ago. He has a 2.00 pack-year smoking history. He has never used smokeless tobacco. He reports current alcohol use. He reports current drug use. Drug: Marijuana.   Family History:  The patient's family history includes Cancer in his maternal grandmother, maternal uncle, and paternal grandfather; Diabetes in his maternal aunt, maternal grandfather, and maternal grandmother; Heart disease in his mother; Hypertension in his  mother; Stroke in his maternal aunt and mother.    ROS:  Please see the history of present illness. All other systems are reviewed and  Negative to the above problem except as noted.    PHYSICAL EXAM: VS:  There were no vitals taken for this visit.  GEN: Well nourished, well developed, in no acute distress  HEENT: normal  Neck: no JVD, carotid bruits, or masses Cardiac: RRR; no murmurs, rubs, or gallops,no edema  Respiratory:  clear to auscultation bilaterally, normal work of breathing GI: soft, nontender, nondistended, + BS  No hepatomegaly  MS: no deformity Moving all extremities   Skin: warm and dry, no rash Neuro:  Strength and sensation are intact Psych: euthymic mood, full affect   EKG:  EKG is ordered today.   Lipid Panel    Component Value Date/Time   CHOL 184 11/25/2016 1540   TRIG 243 (H) 11/25/2016 1540   HDL 58 11/25/2016 1540   CHOLHDL 3.2 11/25/2016 1540   VLDL 49 (H) 11/25/2016 1540   LDLCALC 77 11/25/2016 1540      Wt Readings from Last 3 Encounters:  03/07/19 231 lb (104.8 kg)  02/08/19 228 lb (103.4 kg)  12/05/18 219 lb 12.8 oz (99.7 kg)      ASSESSMENT AND PLAN:     Current medicines are reviewed at  length with the patient today.  The patient does not have concerns regarding medicines.  Signed, Dorris Carnes, MD  10/04/2019 4:38 PM    [address], [place], [place]  [address] Phone: [phone]; Fax: [phone]

## 2019-10-05 ENCOUNTER — Ambulatory Visit: Payer: Self-pay | Admitting: Internal Medicine

## 2019-10-12 ENCOUNTER — Telehealth: Payer: Self-pay

## 2019-10-31 NOTE — Progress Notes (Signed)
Cardiology Office Note   Date:  11/02/2019   ID:  Barnetta Chapel Zill, DOB 12/21/1975, MRN [id number]  PCP:  Patient, No Pcp Per  Cardiologist:   Dorris Carnes, MD    Pt presents for f/u of dizzines and syncope   History of Present Illness: Isael Miklas Rybarczyk is a 44 y.o. male with a history of syncope and collapse Possibly due to orthostaasis.  Echo showed LVEF 55 to 60% Event monitor unremarkable Had funny feeling in chest in March 2020   Seen in [facility] ED   Coronary CT ordered    Calcium score was 0  Pt had colonoscopy done and HR dropped  Set up for event monitior negatvie   Pt seen by B Strader in June 2020.   The patient says he has started working out at gym  Daily  Lifting plus aerobic    He has noticed that he has had more spells of dizziness over the past couple wks  No presyncope    He wants to start running and is concerned  BP at ome in 100s / He denies CP     Breathing is OK   He is still smoking about 1/2 ppd        Current Meds  Medication Sig  . [DISCONTINUED] losartan (COZAAR) 25 MG tablet Take 1 tablet (25 mg total) by mouth daily.     Allergies:   Patient has no known allergies.   Past Medical History:  Diagnosis Date  . Bipolar disorder ([place])   . Chronic back pain   . Essential hypertension   . Migraine   . Sciatic pain   . Syncope 08/2015  . Tobacco abuse     Past Surgical History:  Procedure Laterality Date  . COLONOSCOPY WITH PROPOFOL N/A 02/01/2019   Procedure: COLONOSCOPY WITH PROPOFOL;  Surgeon: Daneil Dolin, MD;  [facility];  Service: Endoscopy;  Laterality: N/A;  12:00pm - office called and gave pt new arrival time  . None    . POLYPECTOMY  02/01/2019   Procedure: POLYPECTOMY;  Surgeon: Daneil Dolin, MD;  [facility];  Service: Endoscopy;;  colon     Social History:  The patient  reports that he has been smoking cigarettes. He started smoking about 23 years ago. He has a 2.00 pack-year smoking history. He  has never used smokeless tobacco. He reports current alcohol use. He reports current drug use. Drug: Marijuana.   Family History:  The patient's family history includes Cancer in his maternal grandmother, maternal uncle, and paternal grandfather; Diabetes in his maternal aunt, maternal grandfather, and maternal grandmother; Heart disease in his mother; Hypertension in his mother; Stroke in his maternal aunt and mother.    ROS:  Please see the history of present illness. All other systems are reviewed and  Negative to the above problem except as noted.    PHYSICAL EXAM: VS:  BP 106/70   Pulse 70   Temp (!) 97.5 F (36.4 C)   Ht 6\' 3"  (1.905 m)   Wt 231 lb (104.8 kg)   SpO2 99%   BMI 28.87 kg/m   GEN: Well nourished, well developed, in no acute distress  HEENT: normal  Neck: no JVD, carotid bruits, or masses Cardiac: RRR; no murmurs, rubs, or gallops,no edema  Respiratory:  clear to auscultation bilaterally, normal work of breathing GI: soft, nontender, nondistended, + BS  No hepatomegaly  MS: no deformity Moving all extremities   Skin:  warm and dry, no rash Neuro:  Strength and sensation are intact Psych: euthymic mood, full affect   EKG:  EKG is not ordered today.   Lipid Panel    Component Value Date/Time   CHOL 184 11/25/2016 1540   TRIG 243 (H) 11/25/2016 1540   HDL 58 11/25/2016 1540   CHOLHDL 3.2 11/25/2016 1540   VLDL 49 (H) 11/25/2016 1540   LDLCALC 77 11/25/2016 1540      Wt Readings from Last 3 Encounters:  11/02/19 231 lb (104.8 kg)  03/07/19 231 lb (104.8 kg)  02/08/19 228 lb (103.4 kg)      ASSESSMENT AND PLAN:  1  Dizziness.   The pt's BP is in low normal range   I think when he is working out it goes lower since he prob is vasodilated and may be a little dry I recomm he stop losartan    Hydrate    Stay active     Call in 2 wks with response    I do not think he needs anything to increase bp  2  HTN   BP improved  Backing off  3   Tob   Counselled on cessation    Tentative f/u Nove/December    Current medicines are reviewed at length with the patient today.  The patient does not have concerns regarding medicines.  Signed, Dorris Carnes, MD  11/02/2019 3:40 PM    [facility] [place] [address], [place], [place]  [address] Phone: [phone]; Fax: [phone]

## 2019-11-02 ENCOUNTER — Encounter: Payer: Self-pay | Admitting: Internal Medicine

## 2019-11-02 ENCOUNTER — Ambulatory Visit (INDEPENDENT_AMBULATORY_CARE_PROVIDER_SITE_OTHER): Payer: Self-pay | Admitting: Internal Medicine

## 2019-11-02 VITALS — BP 106/70 | HR 70 | Temp 97.5°F | Ht 75.0 in | Wt 231.0 lb

## 2019-11-02 DIAGNOSIS — I10 Essential (primary) hypertension: Secondary | ICD-10-CM

## 2019-11-02 DIAGNOSIS — Z720 Tobacco use: Secondary | ICD-10-CM

## 2019-11-02 DIAGNOSIS — R42 Dizziness and giddiness: Secondary | ICD-10-CM

## 2019-11-02 NOTE — Patient Instructions (Signed)
Medication Instructions:  STOP Losartan   *If you need a refill on your cardiac medications before your next appointment, please call your pharmacy*  Lab Work: none If you have labs (blood work) drawn today and your tests are completely normal, you will receive your results only by: Marland Kitchen MyChart Message (if you have MyChart) OR . A paper copy in the mail If you have any lab test that is abnormal or we need to change your treatment, we will call you to review the results.  Testing/Procedures: None  Follow-Up: At [facility], you and your health needs are our priority.  As part of our continuing mission to provide you with exceptional heart care, we have created designated Provider Care Teams.  These Care Teams include your primary Cardiologist (physician) and Advanced Practice Providers (APPs -  Physician Assistants and Nurse Practitioners) who all work together to provide you with the care you need, when you need it.  Your next appointment:   10 month(s)  The format for your next appointment:   In Person  Provider:   Dorris Carnes, MD  Other Instructions  INCREASE fluid intake. Call us in a couple weeks and let us know how you feel and what your blood pressure is. If you are still dizzy, we may need to see you before December.      Thank you for choosing [facility] !

## 2019-11-13 ENCOUNTER — Ambulatory Visit: Payer: Self-pay | Admitting: Cardiology

## 2020-02-17 ENCOUNTER — Emergency Department (HOSPITAL_COMMUNITY): Payer: Self-pay

## 2020-02-17 ENCOUNTER — Other Ambulatory Visit: Payer: Self-pay

## 2020-02-17 ENCOUNTER — Emergency Department (HOSPITAL_COMMUNITY)
Admission: EM | Admit: 2020-02-17 | Discharge: 2020-02-17 | Disposition: A | Discharge status: Home / Self Care | Payer: Self-pay | Attending: Emergency Medicine | Admitting: Emergency Medicine

## 2020-02-17 ENCOUNTER — Encounter (HOSPITAL_COMMUNITY): Payer: Self-pay

## 2020-02-17 DIAGNOSIS — I10 Essential (primary) hypertension: Secondary | ICD-10-CM | POA: Insufficient documentation

## 2020-02-17 DIAGNOSIS — R42 Dizziness and giddiness: Secondary | ICD-10-CM | POA: Insufficient documentation

## 2020-02-17 DIAGNOSIS — R0789 Other chest pain: Secondary | ICD-10-CM | POA: Insufficient documentation

## 2020-02-17 DIAGNOSIS — R002 Palpitations: Secondary | ICD-10-CM | POA: Insufficient documentation

## 2020-02-17 DIAGNOSIS — F17210 Nicotine dependence, cigarettes, uncomplicated: Secondary | ICD-10-CM | POA: Insufficient documentation

## 2020-02-17 DIAGNOSIS — R55 Syncope and collapse: Secondary | ICD-10-CM

## 2020-02-17 LAB — BASIC METABOLIC PANEL
Anion gap: 9 (ref 5–15)
BUN: 13 mg/dL (ref 6–20)
CO2: 23 mmol/L (ref 22–32)
Calcium: 9 mg/dL (ref 8.9–10.3)
Chloride: 106 mmol/L (ref 98–111)
Creatinine, Ser: 1.14 mg/dL (ref 0.61–1.24)
GFR calc Af Amer: >60 mL/min (ref >60)
GFR calc non Af Amer: >60 mL/min (ref >60)
Glucose, Bld: 100 mg/dL — ABNORMAL HIGH (ref 70–99)
Potassium: 3.9 mmol/L (ref 3.5–5.1)
Sodium: 138 mmol/L (ref 135–145)

## 2020-02-17 LAB — CBC
HCT: 44 % (ref 39.0–52.0)
Hemoglobin: 15.2 g/dL (ref 13.0–17.0)
MCH: 33.2 pg (ref 26.0–34.0)
MCHC: 34.5 g/dL (ref 30.0–36.0)
MCV: 96.1 fL (ref 80.0–100.0)
Platelets: 274 10*3/uL (ref 150–400)
RBC: 4.58 MIL/uL (ref 4.22–5.81)
RDW: 13.3 % (ref 11.5–15.5)
WBC: 8.7 10*3/uL (ref 4.0–10.5)
nRBC: 0 % (ref 0.0–0.2)

## 2020-02-17 LAB — TROPONIN I (HIGH SENSITIVITY): Troponin I (High Sensitivity): 4 ng/L (ref <18)

## 2020-02-17 NOTE — ED Provider Notes (Signed)
[facility] EMERGENCY DEPARTMENT Provider Note   CSN: [id number] Arrival date & time: 02/17/20  1913     History Chief Complaint  Patient presents with  . Chest Pain    Maxwell White is a 44 y.o. male.  Patient is a 44 year old male with past medical history of hypertension, syncope, bipolar disorder.  He presents today for evaluation of near syncope and chest discomfort.  This is been ongoing intermittently for the past 3 days.  Patient states that he occasionally has episodes where he feels as though he is going to pass out and feels his heart racing.  He has had similar episodes for the past 5 to 6 years.  He has been seen by his primary doctor and cardiology, but sounds like no definitive causes been found.  He was thought to have possible bradycardic episodes.  The history is provided by the patient.       Past Medical History:  Diagnosis Date  . Bipolar disorder ([place])   . Chronic back pain   . Essential hypertension   . Migraine   . Sciatic pain   . Syncope 08/2015  . Tobacco abuse     Patient Active Problem List   Diagnosis Date Noted  . Normal coronary arteries 02/08/2019  . Bradycardia on ECG 02/08/2019  . Rectal bleeding 12/05/2018  . History of syncope 11/24/2016  . Dizziness 10/29/2015  . Hemorrhoid 12/18/2014  . Tobacco abuse 12/18/2014  . Essential hypertension 12/18/2014  . Bipolar affective disorder ([place]) 12/18/2014    Past Surgical History:  Procedure Laterality Date  . COLONOSCOPY WITH PROPOFOL N/A 02/01/2019   Procedure: COLONOSCOPY WITH PROPOFOL;  Surgeon: Daneil Dolin, MD;  [facility];  Service: Endoscopy;  Laterality: N/A;  12:00pm - office called and gave pt new arrival time  . None    . POLYPECTOMY  02/01/2019   Procedure: POLYPECTOMY;  Surgeon: Daneil Dolin, MD;  [facility];  Service: Endoscopy;;  colon       Family History  Problem Relation Age of Onset  . Hypertension Mother   . Heart disease  Mother   . Stroke Mother   . Diabetes Maternal Aunt   . Stroke Maternal Aunt   . Cancer Maternal Uncle   . Diabetes Maternal Grandmother   . Cancer Maternal Grandmother   . Diabetes Maternal Grandfather   . Cancer Paternal Grandfather     Social History   Tobacco Use  . Smoking status: Current Some Day Smoker    Packs/day: 0.50    Years: 4.00    Pack years: 2.00    Types: Cigarettes    Start date: 09/20/1996  . Smokeless tobacco: Never Used  Substance Use Topics  . Alcohol use: Yes    Alcohol/week: 0.0 standard drinks    Comment: occ  . Drug use: Yes    Types: Marijuana    Comment: daily    Home Medications Prior to Admission medications   Not on File    Allergies    Patient has no known allergies.  Review of Systems   Review of Systems  All other systems reviewed and are negative.   Physical Exam Updated Vital Signs BP (!) 132/91   Pulse 65   Temp 98.4 F (36.9 C) (Oral)   Resp 18   Ht 6\' 3"  (1.905 m)   Wt 104.8 kg   SpO2 99%   BMI 28.88 kg/m   Physical Exam Vitals and nursing note reviewed.  Constitutional:      General: He is not in acute distress.    Appearance: He is well-developed. He is not diaphoretic.  HENT:     Head: Normocephalic and atraumatic.  Cardiovascular:     Rate and Rhythm: Normal rate and regular rhythm.     Heart sounds: No murmur. No friction rub.  Pulmonary:     Effort: Pulmonary effort is normal. No respiratory distress.     Breath sounds: Normal breath sounds. No wheezing or rales.  Abdominal:     General: Bowel sounds are normal. There is no distension.     Palpations: Abdomen is soft.     Tenderness: There is no abdominal tenderness.  Musculoskeletal:        General: Normal range of motion.     Cervical back: Normal range of motion and neck supple.     Right lower leg: No tenderness. No edema.     Left lower leg: No tenderness. No edema.  Skin:    General: Skin is warm and dry.  Neurological:     General: No  focal deficit present.     Mental Status: He is alert and oriented to person, place, and time.     Cranial Nerves: No cranial nerve deficit.     Motor: No weakness.     Coordination: Coordination normal.     ED Results / Procedures / Treatments   Labs (all labs ordered are listed, but only abnormal results are displayed) Labs Reviewed  BASIC METABOLIC PANEL  CBC  TROPONIN I (HIGH SENSITIVITY)    EKG EKG Interpretation  Date/Time:  Sunday Feb 17 2020 19:29:37 EDT Ventricular Rate:  60 PR Interval:    QRS Duration: 108 QT Interval:  414 QTC Calculation: 414 R Axis:   -95 Text Interpretation: Sinus rhythm Atrial premature complexes in couplets Left anterior fascicular block ST elev, probable normal early repol pattern No significant change since 06/16/2017 Confirmed by Veryl Speak [phone]) on 02/17/2020 7:42:58 PM   Radiology DG Chest 2 View  Result Date: 02/17/2020 CLINICAL DATA:  Chest pain. Shortness of breath. Elevated blood pressure. EXAM: CHEST - 2 VIEW COMPARISON:  03/08/2015 FINDINGS: The cardiomediastinal contours are normal. Mild central bronchial thickening. Pulmonary vasculature is normal. No consolidation, pleural effusion, or pneumothorax. No acute osseous abnormalities are seen. IMPRESSION: Mild central bronchial thickening. Electronically Signed   By: Keith Rake M.D.   On: 02/17/2020 20:07    Procedures Procedures (including critical care time)  Medications Ordered in ED Medications - No data to display  ED Course  I have reviewed the triage vital signs and the nursing notes.  Pertinent labs & imaging results that were available during my care of the patient were reviewed by me and considered in my medical decision making (see chart for details).    MDM Rules/Calculators/A&P  Patient is a 44 year old male presenting with complaints of palpitations, chest discomfort and near syncope.  He tells me he has been having these episodes for the past 5 or 6  years.  He has been seen by cardiology for the same, however no definitive cause was found.  These episodes have began occurring again over the past week.  His vital signs are stable and he has a normal physical examination.  Work-up shows unremarkable EKG and laboratory studies that are unremarkable.  I am uncertain as to the exact etiology of this patient's discomfort, however nothing appears emergent.  He will be referred back to cardiology for possible Holter monitoring.  Final Clinical Impression(s) / ED Diagnoses Final diagnoses:  None    Rx / DC Orders ED Discharge Orders    None       Veryl Speak, MD 02/17/20 2303

## 2020-02-17 NOTE — Discharge Instructions (Addendum)
Call the cardiology clinic on Tuesday to arrange a follow-up appointment.  You may benefit from a Holter monitor/event monitor.  Return to the emergency department in the meantime if you develop severe chest pain, difficulty breathing, or other new and concerning symptoms.

## 2020-02-17 NOTE — ED Triage Notes (Signed)
Unable to start triage when pt checked in. Pt stated he went out to smoke first.

## 2020-02-17 NOTE — ED Triage Notes (Signed)
Pt comes in from home via pov for chest pain and htn x3 days. Maxwell White this has happened to him before Says that he has had chest tightness and sob x 3 days with elevated blood pressure.  Said pain comes and goes. 5/10 currently

## 2020-02-19 NOTE — Progress Notes (Deleted)
Cardiology Office Note  Date: 02/19/2020   ID: Maxwell White, DOB 01-18-1976, MRN [id number]  PCP:  Patient, No Pcp Per  Cardiologist:  No primary care provider on file. Electrophysiologist:  None   Chief Complaint: Follow-up, chest pain  History of Present Illness: Maxwell White is a 44 y.o. male with a history of hypertension, syncope, bipolar disorder.  History of smoking about 1/2 pack a day.  Recent ER visit to [facility] on 02/17/2020 with complaint of near syncopal and chest discomfort.  He stated this had been going on intermittently for the past 3 days.  Stated he occasionally had episodes where he felt as though he was going to pass out and felt his heart racing.  Stated he had experienced similar episodes for the past 5 to 6 years.  There was a feeling that patient may have been having bradycardic episodes and may need a possible Holter monitor.  He had recently seen Dr. Harrington Challenger cardiology on November 02, 2019 with similar complaints prior to following up with her.  She mentioned in her note symptoms possibly due to orthostasis.  His last echocardiogram showed LVEF of 55 to 60%.  An event monitor was unremarkable.  He had a funny feeling in his chest in March 2020.  He was seen in [place] ED.  Coronary CT showed a calcium score of zero.  He had colonoscopy done and his heart rate dropped.  She stopped his losartan.  Recommended he continue to hydrate and stay active.  He was counseled on tobacco cessation.  Past Medical History:  Diagnosis Date  . Bipolar disorder ([place])   . Chronic back pain   . Essential hypertension   . Migraine   . Sciatic pain   . Syncope 08/2015  . Tobacco abuse     Past Surgical History:  Procedure Laterality Date  . COLONOSCOPY WITH PROPOFOL N/A 02/01/2019   Procedure: COLONOSCOPY WITH PROPOFOL;  Surgeon: Daneil Dolin, MD;  [facility];  Service: Endoscopy;  Laterality: N/A;  12:00pm - office called and gave pt new arrival time    . None    . POLYPECTOMY  02/01/2019   Procedure: POLYPECTOMY;  Surgeon: Daneil Dolin, MD;  [facility];  Service: Endoscopy;;  colon    No current outpatient medications on file.   No current facility-administered medications for this visit.   Allergies:  Patient has no known allergies.   Social History: The patient  reports that he has been smoking cigarettes. He started smoking about 23 years ago. He has a 2.00 pack-year smoking history. He has never used smokeless tobacco. He reports current alcohol use. He reports current drug use. Drug: Marijuana.   Family History: The patient's family history includes Cancer in his maternal grandmother, maternal uncle, and paternal grandfather; Diabetes in his maternal aunt, maternal grandfather, and maternal grandmother; Heart disease in his mother; Hypertension in his mother; Stroke in his maternal aunt and mother.   ROS:  Please see the history of present illness. Otherwise, complete review of systems is positive for none.  All other systems are reviewed and negative.   Physical Exam: VS:  There were no vitals taken for this visit., BMI There is no height or weight on file to calculate BMI.  Wt Readings from Last 3 Encounters:  02/17/20 231 lb 0.7 oz (104.8 kg)  11/02/19 231 lb (104.8 kg)  03/07/19 231 lb (104.8 kg)    General: Patient appears comfortable at  rest. HEENT: Conjunctiva and lids normal, oropharynx clear with moist mucosa. Neck: Supple, no elevated JVP or carotid bruits, no thyromegaly. Lungs: Clear to auscultation, nonlabored breathing at rest. Cardiac: Regular rate and rhythm, no S3 or significant systolic murmur, no pericardial rub. Abdomen: Soft, nontender, no hepatomegaly, bowel sounds present, no guarding or rebound. Extremities: No pitting edema, distal pulses 2+. Skin: Warm and dry. Musculoskeletal: No kyphosis. Neuropsychiatric: Alert and oriented x3, affect grossly appropriate.  ECG:  {EKG/Telemetry  Strips Reviewed:[id number]}  Recent Labwork: 02/17/2020: BUN 13; Creatinine, Ser 1.14; Hemoglobin 15.2; Platelets 274; Potassium 3.9; Sodium 138     Component Value Date/Time   CHOL 184 11/25/2016 1540   TRIG 243 (H) 11/25/2016 1540   HDL 58 11/25/2016 1540   CHOLHDL 3.2 11/25/2016 1540   VLDL 49 (H) 11/25/2016 1540   LDLCALC 77 11/25/2016 1540    Other Studies Reviewed Today:  Non telemetry monitoring 03/02/2019 Study Highlights  Sinus rhythm.  Sinus tachycardia   NO arrhythmias noted (computer miscalled and SVT that was sinus tach)    Echocardiogram 12/01/2016 Study Conclusions   - Left ventricle: The cavity size was normal. Wall thickness was  increased in a pattern of mild LVH. Systolic function was normal.  The estimated ejection fraction was in the range of 55% to 60%.  Wall motion was normal; there were no regional wall motion  abnormalities. Left ventricular diastolic function parameters  were normal.  - Right ventricle: Systolic function was mildly reduced.    Assessment and Plan:  1. Palpitations   2. Dizziness   3. Essential hypertension   4. Tobacco abuse     1. Palpitations ***  2. Dizziness ***  3. Essential hypertension ***  4. Tobacco abuse   Medication Adjustments/Labs and Tests Ordered: Current medicines are reviewed at length with the patient today.  Concerns regarding medicines are outlined above.   Disposition: Follow-up with Dr. Harrington Challenger or APP  Signed, Levell July, NP 02/19/2020 3:03 PM    [facility] at [place], [place], [place] [address] Phone: [phone]; Fax: [phone]

## 2020-02-20 ENCOUNTER — Ambulatory Visit: Payer: Self-pay | Admitting: Family Medicine

## 2020-02-25 NOTE — Progress Notes (Signed)
Cardiology Office Note  Date: 02/26/2020   ID: Maxwell White, DOB 02-Jul-1976, MRN [id number]  PCP:  Patient, No Pcp Per  Cardiologist:  Dorris Carnes, MD Electrophysiologist:  None   Chief Complaint: Follow-up, chest pain  History of Present Illness: Maxwell White is a 44 y.o. male with a history of hypertension, syncope, bipolar disorder.  History of smoking about 1/2 pack a day.  Recent ER visit to [facility] on 02/17/2020 with complaint of near syncopal and chest discomfort.  He stated this had been going on intermittently for the past 3 days.  Stated he occasionally had episodes where he felt as though he was going to pass out and felt his heart racing.  Stated he had experienced similar episodes for the past 5 to 6 years.  There was a feeling that patient may have been having bradycardic episodes and may need a possible Holter monitor.  He had  seen Dr. Harrington Challenger cardiology on November 02, 2019 with similar complaints prior to following up with her.  She mentioned in her note symptoms possibly due to orthostasis.  His last echocardiogram showed LVEF of 55 to 60%.  An event monitor was unremarkable.  He had a funny feeling in his chest in March 2020.  He was seen in [place] ED.  Coronary CT showed a calcium score of zero.  He had colonoscopy done and his heart rate dropped.  She stopped his losartan.  Recommended he continue to hydrate and stay active.  He was counseled on tobacco cessation.  Patient states he is cut down his caffeine intake but has not totally stopped.  He does continue to smoke but states he has decreased the amount.  He continues to complain of palpitations which sometimes make him feel dizzy.  States she started running recently and notes the palpitations seem to improve after running for a few minutes.  He states he does not notice a pounding heart rhythm during the times he runs.  He states the symptoms have been occurring over the last 5 to 6 years.  He states the last  cardiologist he is saw mentioned an implantable device to wear over an extended period of time to assess what type of arrhythmias he may be having.   Past Medical History:  Diagnosis Date  . Bipolar disorder ([place])   . Chronic back pain   . Essential hypertension   . Migraine   . Sciatic pain   . Syncope 08/2015  . Tobacco abuse     Past Surgical History:  Procedure Laterality Date  . COLONOSCOPY WITH PROPOFOL N/A 02/01/2019   Procedure: COLONOSCOPY WITH PROPOFOL;  Surgeon: Daneil Dolin, MD;  [facility];  Service: Endoscopy;  Laterality: N/A;  12:00pm - office called and gave pt new arrival time  . None    . POLYPECTOMY  02/01/2019   Procedure: POLYPECTOMY;  Surgeon: Daneil Dolin, MD;  [facility];  Service: Endoscopy;;  colon    No current outpatient medications on file.   No current facility-administered medications for this visit.   Allergies:  Patient has no known allergies.   Social History: The patient  reports that he has been smoking cigarettes. He started smoking about 23 years ago. He has a 2.00 pack-year smoking history. He has never used smokeless tobacco. He reports current alcohol use. He reports current drug use. Drug: Marijuana.   Family History: The patient's family history includes Cancer in his maternal grandmother, maternal  uncle, and paternal grandfather; Diabetes in his maternal aunt, maternal grandfather, and maternal grandmother; Heart disease in his mother; Hypertension in his mother; Stroke in his maternal aunt and mother.   ROS:  Please see the history of present illness. Otherwise, complete review of systems is positive for none.  All other systems are reviewed and negative.   Physical Exam: VS:  BP 120/80   Pulse 74   Ht 6\' 3"  (1.905 m)   Wt 216 lb (98 kg)   SpO2 98%   BMI 27.00 kg/m , BMI Body mass index is 27 kg/m.  Wt Readings from Last 3 Encounters:  02/26/20 216 lb (98 kg)  02/17/20 231 lb 0.7 oz (104.8 kg)    11/02/19 231 lb (104.8 kg)    General: Patient appears comfortable at rest. Neck: Supple, no elevated JVP or carotid bruits, no thyromegaly. Lungs: Clear to auscultation, nonlabored breathing at rest. Cardiac: Regular rate and rhythm, no S3 or significant systolic murmur, no pericardial rub. Extremities: No pitting edema, distal pulses 2+. Skin: Warm and dry. Musculoskeletal: No kyphosis. Neuropsychiatric: Alert and oriented x3, affect grossly appropriate.  ECG:  Recent EKG in [facility] emergency room on 02/17/2020 showed sinus rhythm with a rate of 60, atrial premature complexex and couplets.  LAFB, ST elevation probable normal early repolarization pattern  Recent Labwork: 02/17/2020: BUN 13; Creatinine, Ser 1.14; Hemoglobin 15.2; Platelets 274; Potassium 3.9; Sodium 138     Component Value Date/Time   CHOL 184 11/25/2016 1540   TRIG 243 (H) 11/25/2016 1540   HDL 58 11/25/2016 1540   CHOLHDL 3.2 11/25/2016 1540   VLDL 49 (H) 11/25/2016 1540   LDLCALC 77 11/25/2016 1540    Other Studies Reviewed Today:  Non telemetry monitoring 03/02/2019 Study Highlights  Sinus rhythm.  Sinus tachycardia   NO arrhythmias noted (computer miscalled and SVT that was sinus tach)    Echocardiogram 12/01/2016 Study Conclusions   - Left ventricle: The cavity size was normal. Wall thickness was  increased in a pattern of mild LVH. Systolic function was normal.  The estimated ejection fraction was in the range of 55% to 60%.  Wall motion was normal; there were no regional wall motion  abnormalities. Left ventricular diastolic function parameters  were normal.  - Right ventricle: Systolic function was mildly reduced.    Assessment and Plan:  1. Palpitations Continues to complain of palpitations.  He notices them daily.  He states he has been increasing his exercise activities lately including running.  States he notices the palpitations less so when running for a while.  Please get a  30-day event monitor.  2. Dizziness Patient states he has accompanying dizziness during these episodes of palpitations.  He denies any near syncopal or syncopal episodes.   3. Essential hypertension Blood pressure is within normal limits today at 120/80 with a heart rate of 74.  4. Tobacco abuse Continues to smoke but states he has cut down.  He was previously smoking 1/2 pack of cigarettes per day.  Advised cessation.  Medication Adjustments/Labs and Tests Ordered: Current medicines are reviewed at length with the patient today.  Concerns regarding medicines are outlined above.   Disposition: Follow-up with Dr. Harrington Challenger or APP 4 to 6 weeks after event monitor is finished  Signed, Levell July, NP 02/26/2020 2:37 PM    [facility] [place], [place], [place] [address] Phone: [phone]; Fax: [phone]

## 2020-02-26 ENCOUNTER — Ambulatory Visit (INDEPENDENT_AMBULATORY_CARE_PROVIDER_SITE_OTHER): Payer: Self-pay | Admitting: Family Medicine

## 2020-02-26 ENCOUNTER — Other Ambulatory Visit: Payer: Self-pay

## 2020-02-26 ENCOUNTER — Encounter: Payer: Self-pay | Admitting: Family Medicine

## 2020-02-26 VITALS — BP 120/80 | HR 74 | Ht 75.0 in | Wt 216.0 lb

## 2020-02-26 DIAGNOSIS — Z87898 Personal history of other specified conditions: Secondary | ICD-10-CM

## 2020-02-26 DIAGNOSIS — R42 Dizziness and giddiness: Secondary | ICD-10-CM

## 2020-02-26 NOTE — Patient Instructions (Addendum)
Medication Instructions:  Your physician recommends that you continue on your current medications as directed. Please refer to the Current Medication list given to you today.  *If you need a refill on your cardiac medications before your next appointment, please call your pharmacy*   Lab Work: None ordered  If you have labs (blood work) drawn today and your tests are completely normal, you will receive your results only by: Marland Kitchen MyChart Message (if you have MyChart) OR . A paper copy in the mail If you have any lab test that is abnormal or we need to change your treatment, we will call you to review the results.   Testing/Procedures: Your physician has recommended that you wear an event monitor. Event monitors are medical devices that record the heart's electrical activity. Doctors most often [place] these monitors to diagnose arrhythmias. Arrhythmias are problems with the speed or rhythm of the heartbeat. The monitor is a small, portable device. You can wear one while you do your normal daily activities. This is usually used to diagnose what is causing palpitations/syncope (passing out).  This will be mailed to you.    Follow-Up: At [facility], you and your health needs are our priority.  As part of our continuing mission to provide you with exceptional heart care, we have created designated Provider Care Teams.  These Care Teams include your primary Cardiologist (physician) and Advanced Practice Providers (APPs -  Physician Assistants and Nurse Practitioners) who all work together to provide you with the care you need, when you need it.  We recommend signing up for the patient portal called "MyChart".  Sign up information is provided on this After Visit Summary.  MyChart is used to connect with patients for Virtual Visits (Telemedicine).  Patients are able to view lab/test results, encounter notes, upcoming appointments, etc.  Non-urgent messages can be sent to your provider as well.   To learn  more about what you can do with MyChart, go to [email or web address].    Your next appointment:   6 WEEKS   The format for your next appointment:   In Person  Provider:   You may see Dr, Dorris Carnes,  or one of the following Advanced Practice Providers on your designated Care Team:    Bernerd Pho, PA-C   Ermalinda Barrios, [place]     Other Instructions

## 2020-03-17 ENCOUNTER — Ambulatory Visit (INDEPENDENT_AMBULATORY_CARE_PROVIDER_SITE_OTHER): Payer: Self-pay

## 2020-03-17 DIAGNOSIS — R42 Dizziness and giddiness: Secondary | ICD-10-CM

## 2020-03-17 DIAGNOSIS — R002 Palpitations: Secondary | ICD-10-CM

## 2020-03-17 DIAGNOSIS — Z87898 Personal history of other specified conditions: Secondary | ICD-10-CM

## 2020-03-18 ENCOUNTER — Other Ambulatory Visit: Payer: Self-pay

## 2020-04-02 NOTE — Progress Notes (Deleted)
Cardiology Office Note    Date:  04/07/2020   ID:  Maxwell White, DOB 1976-08-13, MRN [id number]  PCP:  Patient, No Pcp Per  Cardiologist: Dorris Carnes, MD EPS: None  No chief complaint on file.   History of Present Illness:  Maxwell White is a 44 y.o. male history of hypertension, syncope felt possibly due to orthostasis in the past with normal LVEF on echo and unremarkable event monitor.  Coronary CT calcium score is 0, bipolar disorder, tobacco abuse.  Patient was last seen by Levell July, NP 02/26/2020 at which time he was having palpitations and trying to cut back on caffeine and cigarettes.  He started running and palpitations improved with exercise.  30-day monitor was ordered.   Past Medical History:  Diagnosis Date   Bipolar disorder ([place])    Chronic back pain    Essential hypertension    Migraine    Sciatic pain    Syncope 08/2015   Tobacco abuse     Past Surgical History:  Procedure Laterality Date   COLONOSCOPY WITH PROPOFOL N/A 02/01/2019   Procedure: COLONOSCOPY WITH PROPOFOL;  Surgeon: Daneil Dolin, MD;  [facility];  Service: Endoscopy;  Laterality: N/A;  12:00pm - office called and gave pt new arrival time   None     POLYPECTOMY  02/01/2019   Procedure: POLYPECTOMY;  Surgeon: Daneil Dolin, MD;  [facility];  Service: Endoscopy;;  colon    Current Medications: No outpatient medications have been marked as taking for the 04/07/20 encounter (Appointment) with Imogene Burn, PA-C.     Allergies:   Patient has no known allergies.   Social History   Socioeconomic History   Marital status: Single    Spouse name: Not on file   Number of children: Not on file   Years of education: Not on file   Highest education level: Not on file  Occupational History   Occupation: [employer]  Tobacco Use   Smoking status: Current Some Day Smoker    Packs/day: 0.50    Years: 4.00    Pack years: 2.00    Types: Cigarettes     Start date: 09/20/1996   Smokeless tobacco: Never Used  Vaping Use   Vaping Use: Never used  Substance and Sexual Activity   Alcohol use: Yes    Alcohol/week: 0.0 standard drinks    Comment: occ   Drug use: Yes    Types: Marijuana    Comment: daily   Sexual activity: Not on file  Other Topics Concern   Not on file  Social History Narrative   Not on file   Social Determinants of Health   Financial Resource Strain:    Difficulty of Paying Living Expenses:   Food Insecurity:    Worried About Charity fundraiser in the Last Year:    Arboriculturist in the Last Year:   Transportation Needs:    Film/video editor (Medical):    Lack of Transportation (Non-Medical):   Physical Activity:    Days of Exercise per Week:    Minutes of Exercise per Session:   Stress:    Feeling of Stress :   Social Connections:    Frequency of Communication with Friends and Family:    Frequency of Social Gatherings with Friends and Family:    Attends Religious Services:    Active Member of Clubs or Organizations:    Attends Archivist Meetings:  Marital Status:      Family History:  The patient's ***family history includes Cancer in his maternal grandmother, maternal uncle, and paternal grandfather; Diabetes in his maternal aunt, maternal grandfather, and maternal grandmother; Heart disease in his mother; Hypertension in his mother; Stroke in his maternal aunt and mother.   ROS:   Please see the history of present illness.    ROS All other systems reviewed and are negative.   PHYSICAL EXAM:   VS:  There were no vitals taken for this visit.  Physical Exam  GEN: Well nourished, well developed, in no acute distress  HEENT: normal  Neck: no JVD, carotid bruits, or masses Cardiac:RRR; no murmurs, rubs, or gallops  Respiratory:  clear to auscultation bilaterally, normal work of breathing GI: soft, nontender, nondistended, + BS Ext: without cyanosis, clubbing, or  edema, Good distal pulses bilaterally MS: no deformity or atrophy  Skin: warm and dry, no rash Neuro:  Alert and Oriented x 3, Strength and sensation are intact Psych: euthymic mood, full affect  Wt Readings from Last 3 Encounters:  02/26/20 216 lb (98 kg)  02/17/20 231 lb 0.7 oz (104.8 kg)  11/02/19 231 lb (104.8 kg)      Studies/Labs Reviewed:   EKG:  EKG is*** ordered today.  The ekg ordered today demonstrates ***  Recent Labs: 02/17/2020: BUN 13; Creatinine, Ser 1.14; Hemoglobin 15.2; Platelets 274; Potassium 3.9; Sodium 138   Lipid Panel    Component Value Date/Time   CHOL 184 11/25/2016 1540   TRIG 243 (H) 11/25/2016 1540   HDL 58 11/25/2016 1540   CHOLHDL 3.2 11/25/2016 1540   VLDL 49 (H) 11/25/2016 1540   LDLCALC 77 11/25/2016 1540    Additional studies/ records that were reviewed today include:  Non telemetry monitoring 03/02/2019 Study Highlights   Sinus rhythm.  Sinus tachycardia   NO arrhythmias noted (computer miscalled and SVT that was sinus tach)      Echocardiogram 12/01/2016 Study Conclusions   - Left ventricle: The cavity size was normal. Wall thickness was    increased in a pattern of mild LVH. Systolic function was normal.    The estimated ejection fraction was in the range of 55% to 60%.    Wall motion was normal; there were no regional wall motion    abnormalities. Left ventricular diastolic function parameters    were normal.  - Right ventricle: Systolic function was mildly reduced.    Coronary CTA 11/28/18 IMPRESSION: 1. Coronary calcium score of 0. This was 0 percentile for age and sex matched control.   2. Normal coronary origin with right dominance.   3. No evidence of CAD.     Electronically Signed   By: Ena Dawley   On: 11/28/2018 16:12       ASSESSMENT:    1. Palpitations   2. Dizziness   3. Essential hypertension   4. Tobacco abuse      PLAN:  In order of problems listed above:  Palpitations 30-day event  monitor  Dizziness with history of syncope felt secondary to orthostasis  Hypertension  Tobacco abuse  Medication Adjustments/Labs and Tests Ordered: Current medicines are reviewed at length with the patient today.  Concerns regarding medicines are outlined above.  Medication changes, Labs and Tests ordered today are listed in the Patient Instructions below. There are no Patient Instructions on file for this visit.   Signed, Ermalinda Barrios, PA-C  04/07/2020 1:51 PM    [facility] [address] [place], [place]  [address] Phone: [phone]; Fax: [phone]

## 2020-04-07 ENCOUNTER — Ambulatory Visit: Payer: Self-pay | Admitting: Physician Assistant

## 2020-05-08 ENCOUNTER — Ambulatory Visit: Payer: Self-pay

## 2020-05-08 ENCOUNTER — Ambulatory Visit: Payer: Self-pay | Attending: Internal Medicine

## 2020-05-08 DIAGNOSIS — Z23 Encounter for immunization: Secondary | ICD-10-CM

## 2020-05-08 NOTE — Progress Notes (Signed)
   Covid-19 Vaccination Clinic  Name:  Jaeshawn Silvio    MRN: [id number] DOB: 1976/04/11  05/08/2020  Mr. Mcclain was observed post Covid-19 immunization for 15 minutes without incident. He was provided with Vaccine Information Sheet and instruction to access the V-Safe system.   Mr. Landfair was instructed to call 911 with any severe reactions post vaccine: Marland Kitchen Difficulty breathing  . Swelling of face and throat  . A fast heartbeat  . A bad rash all over body  . Dizziness and weakness   Immunizations Administered    Name Date Dose VIS Date Route   Moderna COVID-19 Vaccine 05/08/2020 12:19 PM 0.5 mL 08/2019 Intramuscular   Manufacturer: Moderna   Lot: [id number]   [place]: [id number]

## 2020-06-05 ENCOUNTER — Ambulatory Visit: Payer: Self-pay | Attending: Internal Medicine

## 2020-06-05 DIAGNOSIS — Z23 Encounter for immunization: Secondary | ICD-10-CM

## 2020-06-05 NOTE — Progress Notes (Signed)
° °  Covid-19 Vaccination Clinic  Name:  Maxwell White    MRN: [id number] DOB: 28-Sep-1975  06/05/2020  [name] was observed post Covid-19 immunization for 15 minutes without incident. He was provided with Vaccine Information Sheet and instruction to access the V-Safe system.   [name] was instructed to call 911 with any severe reactions post vaccine:  Difficulty breathing   Swelling of face and throat   A fast heartbeat   A bad rash all over body   Dizziness and weakness   Immunizations Administered    Name Date Dose VIS Date Route   Moderna COVID-19 Vaccine 06/05/2020 11:23 AM 0.5 mL 08/2019 Intramuscular   Manufacturer: Levan Hurst   Lot: [id number]   [place]: [id number]      Covid-19 Vaccination Clinic  Name:  Maxwell White    MRN: [id number] DOB: [birth year]  06/05/2020  [name] was observed post Covid-19 immunization for 15 minutes without incident. He was provided with Vaccine Information Sheet and instruction to access the V-Safe system.   [name] was instructed to call 911 with any severe reactions post vaccine:  Difficulty breathing   Swelling of face and throat   A fast heartbeat   A bad rash all over body   Dizziness and weakness   Immunizations Administered    Name Date Dose VIS Date Route   Moderna COVID-19 Vaccine 06/05/2020 11:23 AM 0.5 mL 08/2019 Intramuscular   Manufacturer: Moderna   Lot: [id number]   [place]: [id number]

## 2021-03-29 ENCOUNTER — Other Ambulatory Visit: Payer: Self-pay

## 2021-03-29 ENCOUNTER — Emergency Department (HOSPITAL_COMMUNITY)
Admission: EM | Admit: 2021-03-29 | Discharge: 2021-03-29 | Disposition: A | Discharge status: Home / Self Care | Payer: Self-pay | Attending: Emergency Medicine | Admitting: Emergency Medicine

## 2021-03-29 ENCOUNTER — Emergency Department (HOSPITAL_COMMUNITY): Payer: Self-pay

## 2021-03-29 ENCOUNTER — Encounter (HOSPITAL_COMMUNITY): Payer: Self-pay

## 2021-03-29 DIAGNOSIS — W1830XA Fall on same level, unspecified, initial encounter: Secondary | ICD-10-CM | POA: Insufficient documentation

## 2021-03-29 DIAGNOSIS — Z23 Encounter for immunization: Secondary | ICD-10-CM | POA: Insufficient documentation

## 2021-03-29 DIAGNOSIS — S50312A Abrasion of left elbow, initial encounter: Secondary | ICD-10-CM | POA: Insufficient documentation

## 2021-03-29 DIAGNOSIS — I10 Essential (primary) hypertension: Secondary | ICD-10-CM | POA: Insufficient documentation

## 2021-03-29 DIAGNOSIS — F17210 Nicotine dependence, cigarettes, uncomplicated: Secondary | ICD-10-CM | POA: Insufficient documentation

## 2021-03-29 DIAGNOSIS — W19XXXA Unspecified fall, initial encounter: Secondary | ICD-10-CM

## 2021-03-29 MED ORDER — TETANUS-DIPHTH-ACELL PERTUSSIS 5-2.5-18.5 LF-MCG/0.5 IM SUSY
0.5000 mL | PREFILLED_SYRINGE | Freq: Once | INTRAMUSCULAR | Status: AC
  Administered 2021-03-29: 0.5 mL via INTRAMUSCULAR
  Filled 2021-03-29: qty 0.5

## 2021-03-29 MED ORDER — BACITRACIN ZINC 500 UNIT/GM EX OINT
TOPICAL_OINTMENT | Freq: Once | CUTANEOUS | Status: AC
  Filled 2021-03-29: qty 1.8

## 2021-03-29 MED ORDER — HYDROCODONE-ACETAMINOPHEN 5-325 MG PO TABS
2.0000 | ORAL_TABLET | Freq: Once | ORAL | Status: AC
Start: 2021-03-29 — End: 2021-03-29
  Administered 2021-03-29: 2 via ORAL
  Filled 2021-03-29: qty 2

## 2021-03-29 NOTE — ED Provider Notes (Signed)
[facility] EMERGENCY DEPARTMENT Provider Note   CSN: [id number] Arrival date & time: 03/29/21  0946     History Chief Complaint  Patient presents with   Elbow Pain    Maxwell White is a 45 y.o. male with past medical history significant for bipolar disorder, chronic back pain, essential hypertension, tobacco abuse.  Tetanus is not up-to-date.  HPI Patient presents to emergency department today with chief complaint of left elbow pain x1 day.  Patient states last night he had a mechanical fall White on his left elbow.  He tripped over debris in the driveway and tried to catch himself.  He states he first landed on his right leg and then hit his left elbow on the pavement.  He has abrasions to his elbow.  He cleaned them up prior to coming in with Betadine and applied a dressing over it.  He states he has constant throbbing pain localized to the elbow.  Pain is worse with movement.  He rates pain 10 out of 10 in severity.  He has not taken any over-the-counter medications prior to arrival.  He denies hitting his head or any loss of consciousness.  He denies fever, chills, numbness, weakness or tingling.    Past Medical History:  Diagnosis Date   Bipolar disorder ([place])    Chronic back pain    Essential hypertension    Migraine    Sciatic pain    Syncope 08/2015   Tobacco abuse     Patient Active Problem List   Diagnosis Date Noted   Normal coronary arteries 02/08/2019   Bradycardia on ECG 02/08/2019   Rectal bleeding 12/05/2018   History of syncope 11/24/2016   Dizziness 10/29/2015   Hemorrhoid 12/18/2014   Tobacco abuse 12/18/2014   Essential hypertension 12/18/2014   Bipolar affective disorder ([place]) 12/18/2014    Past Surgical History:  Procedure Laterality Date   COLONOSCOPY WITH PROPOFOL N/A 02/01/2019   Procedure: COLONOSCOPY WITH PROPOFOL;  Surgeon: Daneil Dolin, MD;  Location: [facility];  Service: Endoscopy;  Laterality: N/A;  12:00pm - office called and  gave pt new arrival time   None     POLYPECTOMY  02/01/2019   Procedure: POLYPECTOMY;  Surgeon: Daneil Dolin, MD;  Location: [facility];  Service: Endoscopy;;  colon       Family History  Problem Relation Age of Onset   Hypertension Mother    Heart disease Mother    Stroke Mother    Diabetes Maternal Aunt    Stroke Maternal Aunt    Cancer Maternal Uncle    Diabetes Maternal Grandmother    Cancer Maternal Grandmother    Diabetes Maternal Grandfather    Cancer Paternal Grandfather     Social History   Tobacco Use   Smoking status: Some Days    Packs/day: 0.50    Years: 4.00    Pack years: 2.00    Types: Cigarettes    Start date: 09/20/1996   Smokeless tobacco: Never  Vaping Use   Vaping Use: Never used  Substance Use Topics   Alcohol use: Yes    Alcohol/week: 0.0 standard drinks    Comment: occ   Drug use: Yes    Types: Marijuana    Comment: daily    Home Medications Prior to Admission medications   Not on File    Allergies    Patient has no known allergies.  Review of Systems   Review of Systems All other systems are reviewed  and are negative for acute change except as noted in the HPI.  Physical Exam Updated Vital Signs BP (!) 142/103 (BP Location: Left Arm)   Pulse (!) 54   Temp 98.1 F (36.7 C) (Oral)   Resp 16   Ht 6\' 3"  (1.905 m)   Wt 106.6 kg   SpO2 100%   BMI 29.37 kg/m   Physical Exam Vitals and nursing note reviewed.  Constitutional:      Appearance: He is well-developed. He is not ill-appearing or toxic-appearing.  HENT:     Head: Normocephalic and atraumatic.     Nose: Nose normal.  Eyes:     General: No scleral icterus.       Right eye: No discharge.        Left eye: No discharge.     Conjunctiva/sclera: Conjunctivae normal.  Neck:     Vascular: No JVD.  Cardiovascular:     Rate and Rhythm: Normal rate and regular rhythm.     Pulses: Normal pulses.          Radial pulses are 2+ on the right side and 2+ on the left  side.     Heart sounds: Normal heart sounds.  Pulmonary:     Effort: Pulmonary effort is normal.     Breath sounds: Normal breath sounds.  Abdominal:     General: There is no distension.  Musculoskeletal:     Cervical back: Normal range of motion.     Comments: Compartments in left upper extremity are soft.  Decreased range of motion with flexion of left elbow secondary to pain.  Full range of motion of shoulder and wrist.  No anatomic snuffbox tenderness.  Strong equal grip strength in bilateral upper extremities.  No wounds noted on right lower extremity.  Full and of motion of right lower extremity.  Ambulatory with normal gait.  Skin:    General: Skin is warm and dry.     Capillary Refill: Capillary refill takes less than 2 seconds.     Comments: Two quarter size circular abrasions over olecranon process.  No active bleeding or purulent discharge.  No foreign body seen.  Neurological:     Mental Status: He is oriented to person, place, and time.     GCS: GCS eye subscore is 4. GCS verbal subscore is 5. GCS motor subscore is 6.     Comments: Fluent speech, no facial droop.  Psychiatric:        Behavior: Behavior normal.    ED Results / Procedures / Treatments   Labs (all labs ordered are listed, but only abnormal results are displayed) Labs Reviewed - No data to display  EKG None  Radiology DG Elbow Complete Left  Result Date: 03/29/2021 CLINICAL DATA:  Fall, pain EXAM: LEFT ELBOW - COMPLETE 3+ VIEW COMPARISON:  None. FINDINGS: There is no evidence of fracture, dislocation, or joint effusion. There is no evidence of arthropathy or other focal bone abnormality. Soft tissue edema over the olecranon. IMPRESSION: No fracture or dislocation of the left elbow. No elbow joint effusion. Soft tissue edema over the olecranon. Electronically Signed   By: Eddie Candle M.D.   On: 03/29/2021 10:44    Procedures Procedures   Medications Ordered in ED Medications  Tdap (BOOSTRIX)  injection 0.5 mL (0.5 mLs Intramuscular Given 03/29/21 1009)  HYDROcodone-acetaminophen (NORCO/VICODIN) 5-325 MG per tablet 2 tablet (2 tablets Oral Given 03/29/21 1010)  bacitracin ointment ( Topical Given 03/29/21 1132)    ED Course  I have reviewed the triage vital signs and the nursing notes.  Pertinent labs & imaging results that were available during my care of the patient were reviewed by me and considered in my medical decision making (see chart for details).    MDM Rules/Calculators/A&P                          History provided by patient with additional history obtained from chart review.    Patient presenting after mechanical fall with elbow pain.  He is afebrile and well-appearing.  On exam he has abrasions to left elbow and decreased range of motion secondary to pain.  He is neurovascular intact distally.  X-ray of left elbow reviewed by me shows no acute fracture or dislocation.  Agree with radiologist impression.  Wounds were cleaned.  Tdap given.  Patient given Norco for pain here.  Pain improved after medication.  I dressed wound and discussed wound care with patient.  Strict return precautions discussed.  Recommend Tylenol Motrin for pain at home.   Portions of this note were generated with [employer]. Dictation errors may occur despite best attempts at proofreading.  Final Clinical Impression(s) / ED Diagnoses Final diagnoses:  Fall, initial encounter    Rx / DC Orders ED Discharge Orders     None        Lewanda Rife 03/29/21 1133    Fredia Sorrow, MD 03/31/21 [phone]

## 2021-03-29 NOTE — Discharge Instructions (Addendum)
The xray did not show any broken bones.   Clean wounds at least twice daily. You can apply antibiotic ointment as it heals also- this can be bought over the counter with neosporin. You can leave the dressing we apply on for the first 24 hours.   Watch for signs signs of infection. If you notice any return to the ER.  Take tylenol and motrin for pain at home

## 2021-03-29 NOTE — ED Triage Notes (Signed)
Pt presents to ED after falling last night on left elbow. C/o left elbow pain and laceration.

## 2021-03-29 NOTE — ED Notes (Signed)
Dressing applied by Anda Kraft PA

## 2021-07-06 ENCOUNTER — Emergency Department (HOSPITAL_COMMUNITY): Payer: Self-pay

## 2021-07-06 ENCOUNTER — Encounter (HOSPITAL_COMMUNITY): Payer: Self-pay

## 2021-07-06 ENCOUNTER — Other Ambulatory Visit: Payer: Self-pay

## 2021-07-06 ENCOUNTER — Emergency Department (HOSPITAL_COMMUNITY)
Admission: EM | Admit: 2021-07-06 | Discharge: 2021-07-06 | Disposition: A | Discharge status: Home / Self Care | Payer: Self-pay | Attending: Emergency Medicine | Admitting: Emergency Medicine

## 2021-07-06 DIAGNOSIS — Z79899 Other long term (current) drug therapy: Secondary | ICD-10-CM | POA: Insufficient documentation

## 2021-07-06 DIAGNOSIS — I10 Essential (primary) hypertension: Secondary | ICD-10-CM | POA: Insufficient documentation

## 2021-07-06 DIAGNOSIS — R0789 Other chest pain: Secondary | ICD-10-CM | POA: Insufficient documentation

## 2021-07-06 DIAGNOSIS — F17210 Nicotine dependence, cigarettes, uncomplicated: Secondary | ICD-10-CM | POA: Insufficient documentation

## 2021-07-06 LAB — DIFFERENTIAL
Abs Immature Granulocytes: 0.04 10*3/uL (ref 0.00–0.07)
Basophils Absolute: 0 10*3/uL (ref 0.0–0.1)
Basophils Relative: 0 %
Eosinophils Absolute: 0.1 10*3/uL (ref 0.0–0.5)
Eosinophils Relative: 1 %
Immature Granulocytes: 0 %
Lymphocytes Relative: 22 %
Lymphs Abs: 2.1 10*3/uL (ref 0.7–4.0)
Monocytes Absolute: 0.6 10*3/uL (ref 0.1–1.0)
Monocytes Relative: 7 %
Neutro Abs: 6.4 10*3/uL (ref 1.7–7.7)
Neutrophils Relative %: 70 %

## 2021-07-06 LAB — BASIC METABOLIC PANEL
Anion gap: 6 (ref 5–15)
BUN: 8 mg/dL (ref 6–20)
CO2: 24 mmol/L (ref 22–32)
Calcium: 8.7 mg/dL — ABNORMAL LOW (ref 8.9–10.3)
Chloride: 107 mmol/L (ref 98–111)
Creatinine, Ser: 1.07 mg/dL (ref 0.61–1.24)
GFR, Estimated: >60 mL/min (ref >60)
Glucose, Bld: 96 mg/dL (ref 70–99)
Potassium: 3.8 mmol/L (ref 3.5–5.1)
Sodium: 137 mmol/L (ref 135–145)

## 2021-07-06 LAB — CBC
HCT: 45.9 % (ref 39.0–52.0)
Hemoglobin: 15.1 g/dL (ref 13.0–17.0)
MCH: 31.8 pg (ref 26.0–34.0)
MCHC: 32.9 g/dL (ref 30.0–36.0)
MCV: 96.6 fL (ref 80.0–100.0)
Platelets: 267 10*3/uL (ref 150–400)
RBC: 4.75 MIL/uL (ref 4.22–5.81)
RDW: 13.2 % (ref 11.5–15.5)
WBC: 9.2 10*3/uL (ref 4.0–10.5)
nRBC: 0 % (ref 0.0–0.2)

## 2021-07-06 LAB — TROPONIN I (HIGH SENSITIVITY)
Troponin I (High Sensitivity): 11 ng/L (ref <18)
Troponin I (High Sensitivity): 11 ng/L (ref <18)

## 2021-07-06 MED ORDER — HYDROCODONE-ACETAMINOPHEN 5-325 MG PO TABS
1.0000 | ORAL_TABLET | Freq: Once | ORAL | Status: AC
  Administered 2021-07-06: 1 via ORAL
  Filled 2021-07-06: qty 1

## 2021-07-06 MED ORDER — IBUPROFEN 800 MG PO TABS
800.0000 mg | ORAL_TABLET | Freq: Three times a day (TID) | ORAL | 0 refills | Status: DC
Start: 2021-07-06 — End: 2023-01-12

## 2021-07-06 MED ORDER — METHOCARBAMOL 500 MG PO TABS: 1000.0000 mg | ORAL_TABLET | Freq: Two times a day (BID) | ORAL | 0 refills | Status: AC

## 2021-07-06 NOTE — ED Provider Notes (Signed)
Emergency Medicine Provider Triage Evaluation Note  Maxwell White , a 45 y.o. male  was evaluated in triage.  Pt complains of right shoulder/chest pain.  Started on Friday, constant.  Worsened by movement of the arm.  Denies any shortness of breath, nausea, vomiting.  No history of previous heart attacks, denies any injury to the shoulder..  Review of Systems  Positive: Chest pain, right shoulder pain Negative: Nausea, vomiting  Physical Exam  BP (!) 148/111 (BP Location: Right Arm)   Pulse 60   Temp 98.3 F (36.8 C) (Oral)   Resp 16   Ht 6\' 3"  (1.905 m)   Wt 102.1 kg   SpO2 100%   BMI 28.12 kg/m  Gen:   Awake, no distress   Resp:  Normal effort  MSK:   Moves extremities without difficulty  Other:  Reproducible chest wall tenderness  Medical Decision Making  Medically screening exam initiated at 10:44 AM.  Appropriate orders placed.  Maxwell White was informed that the remainder of the evaluation will be completed by another provider, this initial triage assessment does not replace that evaluation, and the importance of remaining in the ED until their evaluation is complete.  Suspect musculoskeletal, cardiac labs to evaluate for ACS.   Sherrill Raring, PA-C 07/06/21 [place], Gage, DO 07/06/21 Jeri Lager

## 2021-07-06 NOTE — ED Triage Notes (Signed)
Pt presents to ED with complaints of right chest pressure radiating to back and shoulder since Saturday. Pt states he feels this area is also swollen.

## 2021-07-06 NOTE — ED Provider Notes (Signed)
[facility] EMERGENCY DEPARTMENT Provider Note   CSN: [id number] Arrival date & time: 07/06/21  1002     History Chief Complaint  Patient presents with   Chest Pain    Maxwell White is a 45 y.o. male.  This is a 45 y.o. male with significant medical history as below, including BPD, HTN who presents to the ED with complaint of right sided chest pain.  Location:  right lateral chest Duration:  24 hrs Onset:  gradual Timing:  constant Description:  aching, sharp, stabbing Severity:  mild Exacerbating/Alleviating Factors:  worse with arm movement, torso twisting, direct palpation Associated Symptoms:  none Pertinent Negatives:  no dyspnea, nausea, diaphoresis, light headedness, fevers or chills   The history is provided by the patient. No language interpreter was used.  Chest Pain Pain location:  R chest Pain quality: aching   Pain radiates to:  Does not radiate Pain severity:  Mild Onset quality:  Gradual Associated symptoms: no abdominal pain, no cough, no dysphagia, no fever, no headache, no nausea, no palpitations, no shortness of breath and no vomiting    HPI: A 45 year old patient with a history of hypertension presents for evaluation of chest pain. Initial onset of pain was approximately 1-3 hours ago. The patient's chest pain is well-localized, is sharp and is not worse with exertion. The patient's chest pain is not middle- or left-sided, is not described as heaviness/pressure/tightness and does not radiate to the arms/jaw/neck. The patient does not complain of nausea and denies diaphoresis. The patient has no history of stroke, has no history of peripheral artery disease, has not smoked in the past 90 days, denies any history of treated diabetes, has no relevant family history of coronary artery disease (first degree relative at less than age 63), has no history of hypercholesterolemia and does not have an elevated BMI (>=30).   Past Medical History:  Diagnosis Date    Bipolar disorder ([place])    Chronic back pain    Essential hypertension    Migraine    Sciatic pain    Syncope 08/2015   Tobacco abuse     Patient Active Problem List   Diagnosis Date Noted   Normal coronary arteries 02/08/2019   Bradycardia on ECG 02/08/2019   Rectal bleeding 12/05/2018   History of syncope 11/24/2016   Dizziness 10/29/2015   Hemorrhoid 12/18/2014   Tobacco abuse 12/18/2014   Essential hypertension 12/18/2014   Bipolar affective disorder ([place]) 12/18/2014    Past Surgical History:  Procedure Laterality Date   COLONOSCOPY WITH PROPOFOL N/A 02/01/2019   Procedure: COLONOSCOPY WITH PROPOFOL;  Surgeon: Daneil Dolin, MD;  Location: [facility];  Service: Endoscopy;  Laterality: N/A;  12:00pm - office called and gave pt new arrival time   None     POLYPECTOMY  02/01/2019   Procedure: POLYPECTOMY;  Surgeon: Daneil Dolin, MD;  Location: [facility];  Service: Endoscopy;;  colon       Family History  Problem Relation Age of Onset   Hypertension Mother    Heart disease Mother    Stroke Mother    Diabetes Maternal Aunt    Stroke Maternal Aunt    Cancer Maternal Uncle    Diabetes Maternal Grandmother    Cancer Maternal Grandmother    Diabetes Maternal Grandfather    Cancer Paternal Grandfather     Social History   Tobacco Use   Smoking status: Some Days    Packs/day: 0.50    Years: 4.00  Pack years: 2.00    Types: Cigarettes    Start date: 09/20/1996   Smokeless tobacco: Never  Vaping Use   Vaping Use: Never used  Substance Use Topics   Alcohol use: Yes    Alcohol/week: 0.0 standard drinks    Comment: occ   Drug use: Yes    Types: Marijuana    Comment: daily    Home Medications Prior to Admission medications   Medication Sig Start Date End Date Taking? Authorizing Provider  ibuprofen (ADVIL) 800 MG tablet Take 1 tablet (800 mg total) by mouth 3 (three) times daily. Take with meals 07/06/21  Yes Wynona Dove A, DO  methocarbamol  (ROBAXIN) 500 MG tablet Take 2 tablets (1,000 mg total) by mouth 2 (two) times daily for 7 days. 07/06/21 07/13/21 Yes Jeanell Sparrow, DO    Allergies    Patient has no known allergies.  Review of Systems   Review of Systems  Constitutional:  Negative for chills and fever.  HENT:  Negative for facial swelling and trouble swallowing.   Eyes:  Negative for photophobia and visual disturbance.  Respiratory:  Negative for cough and shortness of breath.   Cardiovascular:  Positive for chest pain. Negative for palpitations.  Gastrointestinal:  Negative for abdominal pain, nausea and vomiting.  Endocrine: Negative for polydipsia and polyuria.  Genitourinary:  Negative for difficulty urinating and hematuria.  Musculoskeletal:  Negative for gait problem and joint swelling.  Skin:  Negative for pallor and rash.  Neurological:  Negative for syncope and headaches.  Psychiatric/Behavioral:  Negative for agitation and confusion.    Physical Exam Updated Vital Signs BP (!) 143/104   Pulse 65   Temp 98.3 F (36.8 C) (Oral)   Resp 16   Ht 6\' 3"  (1.905 m)   Wt 102.1 kg   SpO2 100%   BMI 28.12 kg/m   Physical Exam Vitals and nursing note reviewed.  Constitutional:      General: He is not in acute distress.    Appearance: He is well-developed.  HENT:     Head: Normocephalic and atraumatic.     Right Ear: External ear normal.     Left Ear: External ear normal.     Mouth/Throat:     Mouth: Mucous membranes are moist.  Eyes:     General: No scleral icterus. Cardiovascular:     Rate and Rhythm: Normal rate and regular rhythm.     Pulses: Normal pulses.          Radial pulses are 2+ on the right side and 2+ on the left side.       Dorsalis pedis pulses are 2+ on the right side and 2+ on the left side.     Heart sounds: Normal heart sounds.  Pulmonary:     Effort: Pulmonary effort is normal. No respiratory distress.     Breath sounds: Normal breath sounds.  Chest:    Abdominal:      General: Abdomen is flat.     Palpations: Abdomen is soft.     Tenderness: There is no abdominal tenderness.  Musculoskeletal:        General: Normal range of motion.     Cervical back: Normal range of motion.     Right lower leg: No edema.     Left lower leg: No edema.  Skin:    General: Skin is warm and dry.     Capillary Refill: Capillary refill takes less than 2 seconds.  Neurological:  Mental Status: He is alert and oriented to person, place, and time.  Psychiatric:        Mood and Affect: Mood normal.        Behavior: Behavior normal.    ED Results / Procedures / Treatments   Labs (all labs ordered are listed, but only abnormal results are displayed) Labs Reviewed  BASIC METABOLIC PANEL - Abnormal; Notable for the following components:      Result Value   Calcium 8.7 (*)    All other components within normal limits  CBC  DIFFERENTIAL  TROPONIN I (HIGH SENSITIVITY)  TROPONIN I (HIGH SENSITIVITY)    EKG EKG Interpretation  Date/Time:  Monday July 06 2021 10:35:55 EDT Ventricular Rate:  60 PR Interval:  142 QRS Duration: 108 QT Interval:  400 QTC Calculation: 400 R Axis:   265 Text Interpretation: Normal sinus rhythm Incomplete right bundle branch block Right ventricular hypertrophy Abnormal ECG similar to prior tracing Confirmed by Wynona Dove ([id number]) on 07/06/2021 10:43:35 AM  Radiology DG Chest 2 View  Result Date: 07/06/2021 CLINICAL DATA:  Chest pain. EXAM: CHEST - 2 VIEW COMPARISON:  None. FINDINGS: The heart size and mediastinal contours are within normal limits. Both lungs are clear. The visualized skeletal structures are unremarkable. Negative for a pneumothorax. IMPRESSION: No active cardiopulmonary disease. Electronically Signed   By: Markus Daft M.D.   On: 07/06/2021 11:47    Procedures Procedures   Medications Ordered in ED Medications  HYDROcodone-acetaminophen (NORCO/VICODIN) 5-325 MG per tablet 1 tablet (1 tablet Oral Given 07/06/21 1439)     ED Course  I have reviewed the triage vital signs and the nursing notes.  Pertinent labs & imaging results that were available during my care of the patient were reviewed by me and considered in my medical decision making (see chart for details).    MDM Rules/Calculators/A&P HEAR Score: 1                       This patient complains of chest pain; this involves an extensive number of treatment Options and is a complaint that carries with it a high risk of complications and Morbidity. Vital signs are stable. Serious etiologies considered.   I ordered, reviewed and interpreted labs  EKG stable, no stemi. Trop negative, CXR negative. ACS unlikely.  I ordered medication norco  I ordered imaging studies which included CXR and I independently    visualized and interpreted imaging which showed no acute process  Previous records obtained and reviewed   The patient's chest pain is not suggestive of pulmonary embolus, cardiac ischemia, aortic dissection, pericarditis, myocarditis, pulmonary embolism, pneumothorax, pneumonia, Zoster, or esophageal perforation, or other serious etiology.  Historically not abrupt in onset, tearing or ripping, pulses symmetric. EKG nonspecific for ischemia/infarction. No dysrhythmias, brugada, WPW, prolonged QT noted.  Troponin negative x2. CXR reviewed. Labs without demonstration of acute pathology unless otherwise noted above. Low HEART Score: 0-3 points (0.9-1.7% risk of MACE). Given the extremely low risk of these diagnoses further testing and evaluation for these possibilities does not appear to be indicated at this time. Patient in no distress and overall condition improved here in the ED. Detailed discussions were had with the patient regarding current findings, and need for close f/u with PCP or on call doctor. The patient has been instructed to return immediately if the symptoms worsen in any way for re-evaluation. Patient verbalized understanding and is in  agreement with current care plan. All questions answered  prior to discharge.      Final Clinical Impression(s) / ED Diagnoses Final diagnoses:  Atypical chest pain    Rx / DC Orders ED Discharge Orders          Ordered    methocarbamol (ROBAXIN) 500 MG tablet  2 times daily        07/06/21 1552    ibuprofen (ADVIL) 800 MG tablet  3 times daily        07/06/21 1552             Jeanell Sparrow, DO 07/06/21 1823

## 2021-10-19 IMAGING — DX DG CHEST 2V
2 series · 2 of 2 positions shown · non-contrast
Comparison: None.

CLINICAL DATA: Chest pain.

EXAM:
CHEST - 2 VIEW

[chest pa]
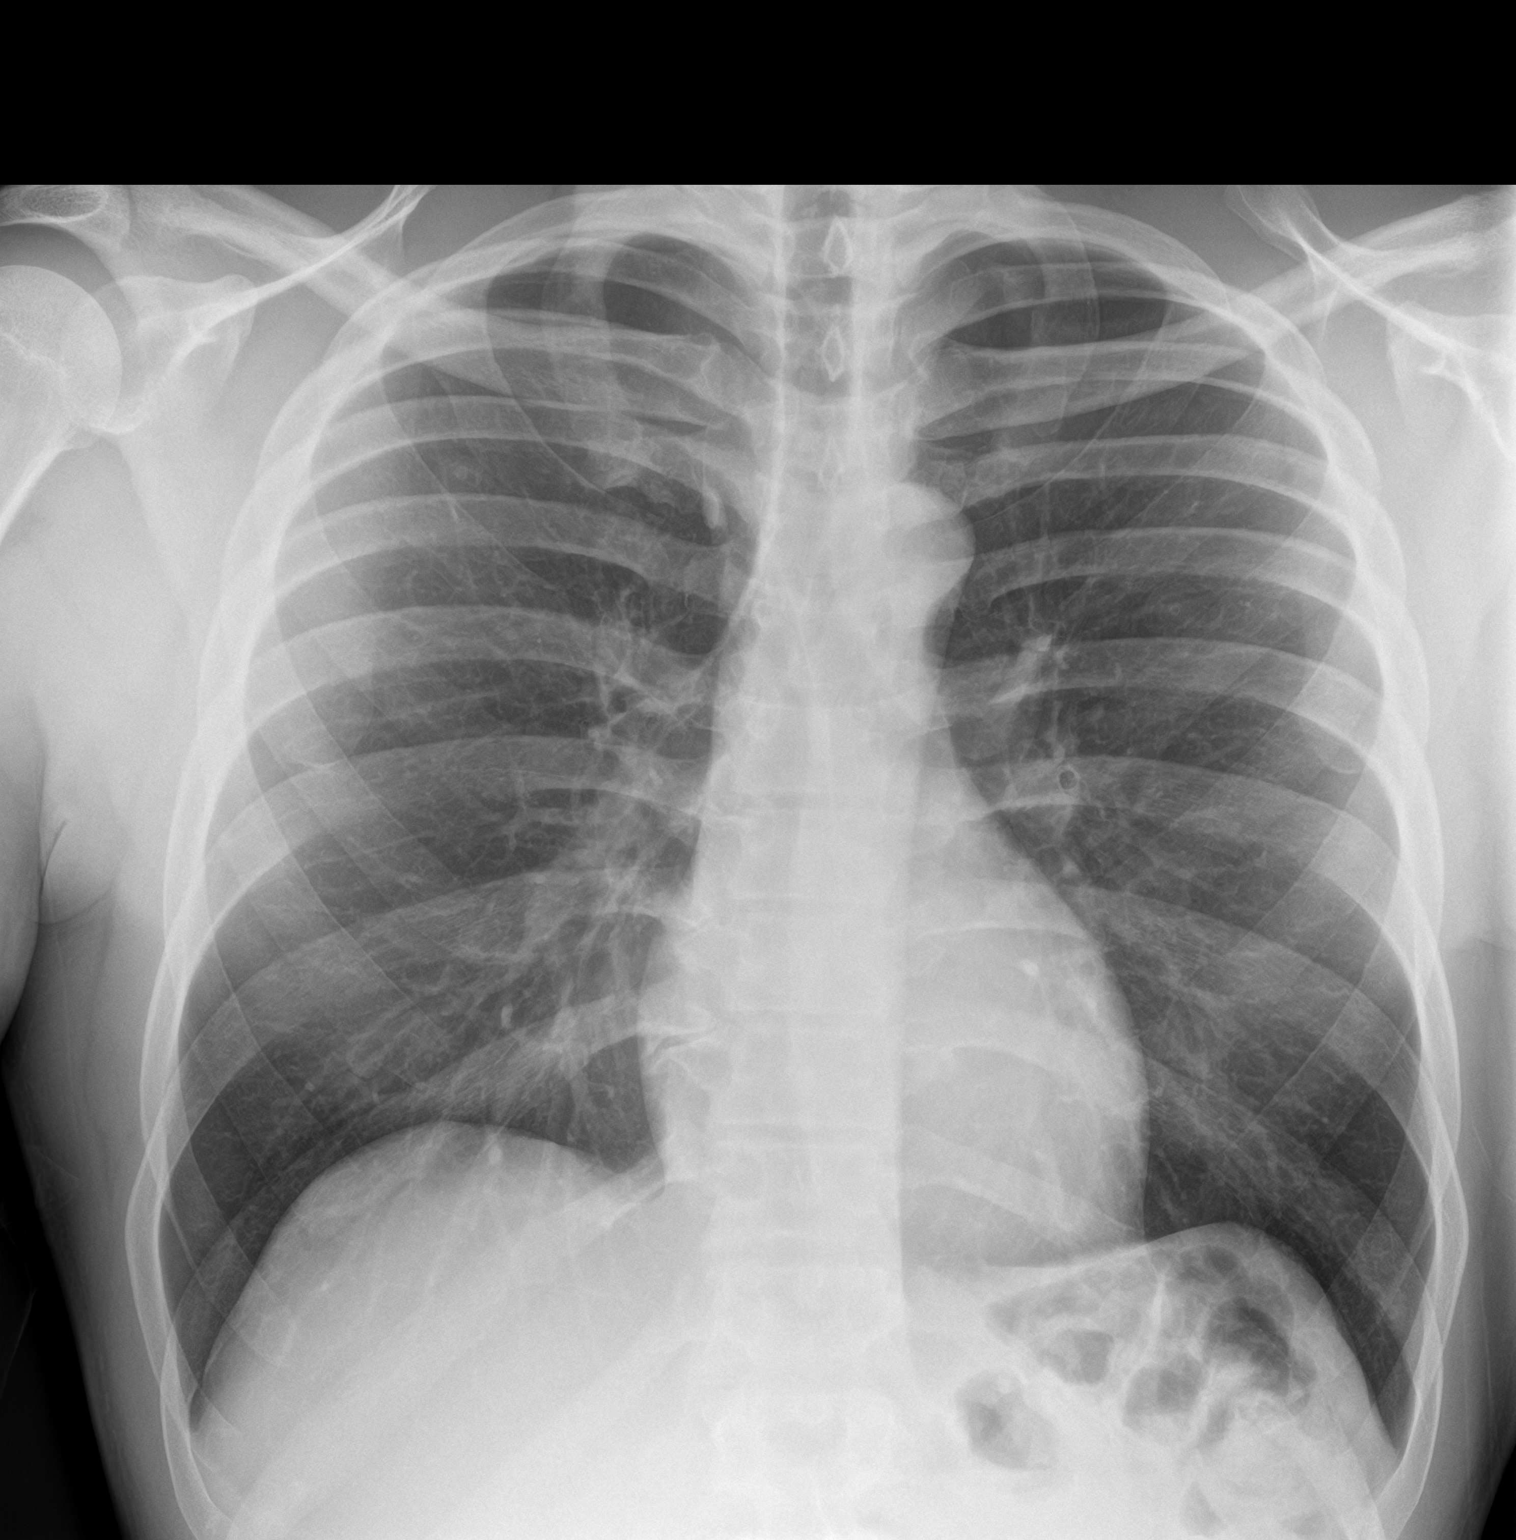

[chest lat]
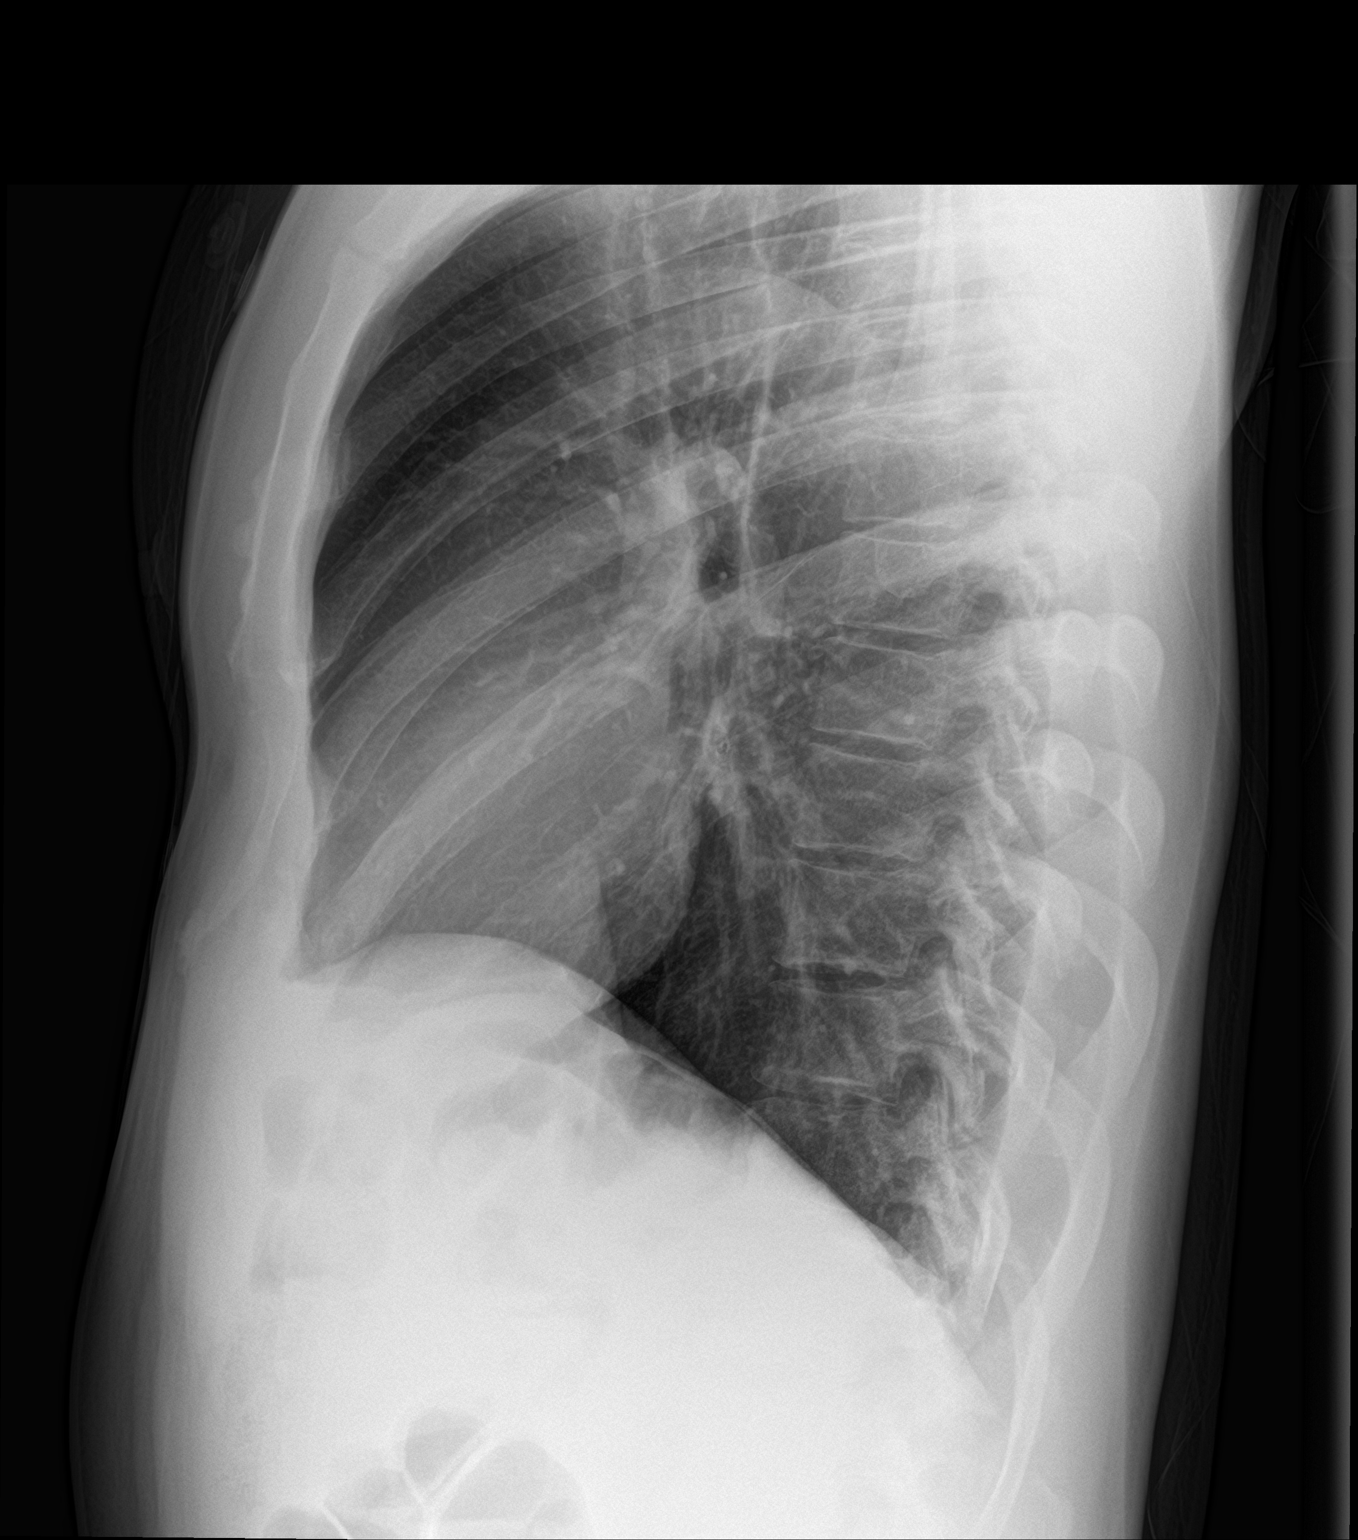

[2 of 2 positions shown; findings below may reference images not displayed]

FINDINGS: The heart size and mediastinal contours are within normal limits.
Both lungs are clear. The visualized skeletal structures are
unremarkable. Negative for a pneumothorax.
IMPRESSION: No active cardiopulmonary disease.

## 2021-11-13 ENCOUNTER — Ambulatory Visit: Payer: Self-pay | Admitting: Internal Medicine

## 2022-01-01 ENCOUNTER — Encounter: Payer: Self-pay | Admitting: Student

## 2022-01-01 ENCOUNTER — Ambulatory Visit (INDEPENDENT_AMBULATORY_CARE_PROVIDER_SITE_OTHER): Payer: Self-pay | Admitting: Student

## 2022-01-01 VITALS — BP 130/78 | HR 72 | Ht 75.0 in | Wt 217.9 lb

## 2022-01-01 DIAGNOSIS — Z87898 Personal history of other specified conditions: Secondary | ICD-10-CM

## 2022-01-01 DIAGNOSIS — R55 Syncope and collapse: Secondary | ICD-10-CM

## 2022-01-01 NOTE — Progress Notes (Signed)
? ?Cardiology Office Note   ? ?Date:  01/01/2022  ? ?ID:  Maxwell White, DOB 01/24/1976, MRN [id number] ? ?PCP:  Pcp, No  ?Cardiologist: Dorris Carnes, MD   ? ?Chief Complaint  ?Patient presents with  ? Follow-up  ?  Overdue Visit  ? ? ?History of Present Illness:   ? ?Maxwell White is a 46 y.o. male with past medical history of chest pain (calcium score of 0 by Coronary CT in 11/2018), recurrent syncope, chronic back pain and bipolar disorder who presents to the office today for overdue follow-up. ? ?He was last examined by Katina Dung, NP in 02/2020 and reported occasional palpitations with associated dizziness which had been occurring intermittently for over 5 years. A 30-day monitor was obtained which showed predominantly sinus rhythm with brief episodes of sinus tachycardia but no significant arrhythmias. ? ?By review of Care Everywhere, he was evaluated at [facility] in 10/2021 for intermittent chest pain for the past month. Troponin values were negative and his EKG showed LVH with associated repolarization abnormalities but no acute findings. He was discharged home and informed to follow-up with Cardiology as an outpatient. ? ?In talking with the patient today, he reports he had a recurrent syncopal episode a few months back and this occurred while standing. He developed floaters and dizziness and then lost consciousness while standing. Was out for a few seconds. No associated chest pain, palpitations or dyspnea. Says this was his 5th syncopal episode within the past 2-3 years and all episodes have occurred while standing with similar prodromal symptoms. Says he stays hydrated and consumes almost a gallon of water each day. He does exercise and denies any chest pain with this or dyspnea on exertion. No recent orthopnea, PND or pitting edema.  ? ?Past Medical History:  ?Diagnosis Date  ? Bipolar disorder ([place])   ? Chest pain   ? a. calcium score of 0 by Coronary CT in 11/2018  ? Chronic back pain   ?  Essential hypertension   ? Migraine   ? Sciatic pain   ? Syncope 08/2015  ? Tobacco abuse   ? ? ?Past Surgical History:  ?Procedure Laterality Date  ? COLONOSCOPY WITH PROPOFOL N/A 02/01/2019  ? Procedure: COLONOSCOPY WITH PROPOFOL;  Surgeon: Daneil Dolin, MD;  [facility];  Service: Endoscopy;  Laterality: N/A;  12:00pm - office called and gave pt new arrival time  ? None    ? POLYPECTOMY  02/01/2019  ? Procedure: POLYPECTOMY;  Surgeon: Daneil Dolin, MD;  [facility];  Service: Endoscopy;;  colon  ? ? ?Current Medications: ?Outpatient Medications Prior to Visit  ?Medication Sig Dispense Refill  ? ibuprofen (ADVIL) 800 MG tablet Take 1 tablet (800 mg total) by mouth 3 (three) times daily. Take with meals (Patient not taking: Reported on 01/01/2022) 21 tablet 0  ? ?No facility-administered medications prior to visit.  ?  ? ?Allergies:   Patient has no known allergies.  ? ?Social History  ? ?Socioeconomic History  ? Marital status: Single  ?  Spouse name: Not on file  ? Number of children: Not on file  ? Years of education: Not on file  ? Highest education level: Not on file  ?Occupational History  ? Occupation: [employer]  ?Tobacco Use  ? Smoking status: Some Days  ?  Packs/day: 0.50  ?  Years: 4.00  ?  Pack years: 2.00  ?  Types: Cigarettes  ?  Start date: 09/20/1996  ?  Smokeless tobacco: Never  ?Vaping Use  ? Vaping Use: Never used  ?Substance and Sexual Activity  ? Alcohol use: Yes  ?  Alcohol/week: 0.0 standard drinks  ?  Comment: occ  ? Drug use: Yes  ?  Types: Marijuana  ?  Comment: daily  ? Sexual activity: Not on file  ?Other Topics Concern  ? Not on file  ?Social History Narrative  ? Not on file  ? ?Social Determinants of Health  ? ?Financial Resource Strain: Not on file  ?Food Insecurity: Not on file  ?Transportation Needs: Not on file  ?Physical Activity: Not on file  ?Stress: Not on file  ?Social Connections: Not on file  ?  ? ?Family History:  The patient's family history includes  Cancer in his maternal grandmother, maternal uncle, and paternal grandfather; Diabetes in his maternal aunt, maternal grandfather, and maternal grandmother; Heart disease in his mother; Hypertension in his mother; Stroke in his maternal aunt and mother.  ? ?Review of Systems:   ? ?Please see the history of present illness.    ? ?All other systems reviewed and are otherwise negative except as noted above. ? ? ?Physical Exam:   ? ?VS:  BP 130/78   Pulse 72   Ht '6\' 3"'$  (1.905 m)   Wt 217 lb 14.4 oz (98.8 kg)   SpO2 98%   BMI 27.24 kg/m?    ?General: Well developed, well nourished,male appearing in no acute distress. ?Head: Normocephalic, atraumatic. ?Neck: No carotid bruits. JVD not elevated.  ?Lungs: Respirations regular and unlabored, without wheezes or rales.  ?Heart: Regular rate and rhythm. No S3 or S4.  No murmur, no rubs, or gallops appreciated. ?Abdomen: Appears non-distended. No obvious abdominal masses. ?Msk:  Strength and tone appear normal for age. No obvious joint deformities or effusions. ?Extremities: No clubbing or cyanosis. No pitting edema.  Distal pedal pulses are 2+ bilaterally. ?Neuro: Alert and oriented X 3. Moves all extremities spontaneously. No focal deficits noted. ?Psych:  Responds to questions appropriately with a normal affect. ?Skin: No rashes or lesions noted ? ?Wt Readings from Last 3 Encounters:  ?01/01/22 217 lb 14.4 oz (98.8 kg)  ?07/06/21 225 lb (102.1 kg)  ?03/29/21 235 lb (106.6 kg)  ?  ? ?Studies/Labs Reviewed:  ? ?EKG:  EKG is ordered today. The EKG ordered today demonstrates NSR, HR 73 with incomplete RBBB. ST changes along inferior leads have improved when compared to prior tracings.  ? ?Recent Labs: ?07/06/2021: BUN 8; Creatinine, Ser 1.07; Hemoglobin 15.1; Platelets 267; Potassium 3.8; Sodium 137  ? ?Lipid Panel ?   ?Component Value Date/Time  ? CHOL 184 11/25/2016 1540  ? TRIG 243 (H) 11/25/2016 1540  ? HDL 58 11/25/2016 1540  ? CHOLHDL 3.2 11/25/2016 1540  ? VLDL 49 (H)  11/25/2016 1540  ? [place] 77 11/25/2016 1540  ? ? ?Additional studies/ records that were reviewed today include:  ? ?Coronary CT: 11/2018 ?Coronary Arteries:  Normal coronary origin.  Right dominance. ?  ?RCA is a large dominant artery that gives rise to PDA and PLVB. ?There is no plaque. ?  ?Left main is a large artery that gives rise to LAD and LCX arteries. ?Left main has no plaque. ?  ?LAD is a large vessel that gives rise to one large diagonal artery ?and has no plaque. ?  ?LCX is a non-dominant artery that gives rise to three large OM ?branches. There is no plaque. ?  ?Other findings: ?  ?Normal pulmonary vein drainage into  the left atrium. ?  ?Normal let atrial appendage without a thrombus. ?  ?Normal size of the pulmonary artery. ?  ?IMPRESSION: ?1. Coronary calcium score of 0. This was 0 percentile for age and ?sex matched control. ?  ?2. Normal coronary origin with right dominance. ?  ?3. No evidence of CAD. ?  ?Event Monitor: 03/2020 ?Sinus rhythm, sinus tachycardia   No significant arrhythmias   ? ?Assessment:   ? ?1. Recurrent syncope   ?2. History of chest pain   ? ? ? ?Plan:  ? ?In order of problems listed above: ? ?1. Recurrent Syncopal Episodes ?- He reports at least 5 syncopal episodes over the past 2-3 years which have occurred while standing and he reports associated dizziness and floaters with the episodes. Prior monitors in 2018, 2020 and 2021 showed no significant arrhythmias but he reports he did not have syncopal episodes while wearing monitors in the past. He reports it was previously recommended he might benefit from an implantable loop recorder given his infrequent symptoms and he now wishes to pursue this if able. Will refer to EP to see if this is an option for the patient given his recurrent but infrequent syncopal episodes. Will update an echocardiogram to assess for any structural abnormalities. ? ?2. History of Chest Pain ?- Prior Coronary CT in 11/2018 showed a calcium score of 0.  Recent cardiac workup during ED evaluation was reassuring as Hs Troponin values were negative and EKG today is without acute ST changes. Will obtain a follow-up echocardiogram as discussed above.  ? ? ? ?Medication

## 2022-01-01 NOTE — Patient Instructions (Signed)
Medication Instructions:  ?Your physician recommends that you continue on your current medications as directed. Please refer to the Current Medication list given to you today. ? ? ?Labwork: ?None today ? ?Testing/Procedures: ?Your physician has requested that you have an echocardiogram. Echocardiography is a painless test that uses sound waves to create images of your heart. It provides your doctor with information about the size and shape of your heart and how well your heart?s chambers and valves are working. This procedure takes approximately one hour. There are no restrictions for this procedure. ? ? ?Follow-Up: ?6 months with Dr.Ross ? ?Any Other Special Instructions Will Be Listed Below (If Applicable). ? ? ?You have been referred to Electrophysiology for Consult for loop recorder ? ? ?If you need a refill on your cardiac medications before your next appointment, please call your pharmacy. ? ?

## 2022-01-07 ENCOUNTER — Telehealth (HOSPITAL_COMMUNITY): Payer: Self-pay | Admitting: Licensed Clinical Social Worker

## 2022-01-07 NOTE — Telephone Encounter (Signed)
CSW received referral to assist patient with insurance options. CSW contacted patient and unable to leave message as no voicemail set up. Will continue to attempt contact. Raquel Sarna, [place], [place] ? ?

## 2022-01-14 ENCOUNTER — Ambulatory Visit (HOSPITAL_COMMUNITY): Admission: RE | Admit: 2022-01-14 | Payer: Self-pay | Source: Ambulatory Visit

## 2022-02-05 ENCOUNTER — Encounter: Payer: Self-pay | Admitting: Internal Medicine

## 2022-02-05 ENCOUNTER — Ambulatory Visit (INDEPENDENT_AMBULATORY_CARE_PROVIDER_SITE_OTHER): Payer: Self-pay | Admitting: Internal Medicine

## 2022-02-05 VITALS — BP 146/88 | HR 62 | Ht 75.0 in | Wt 210.4 lb

## 2022-02-05 DIAGNOSIS — Z87898 Personal history of other specified conditions: Secondary | ICD-10-CM

## 2022-02-05 NOTE — Progress Notes (Signed)
HPI [name] is referred by [place] for evaluation of syncope. He is a pleasant 46 yo man with over 20 episodes of syncope dating back to his teenage years. The spells always start when he is standing and can occur immediately with standing up or after he has been up for awhile. He always has some warning. He feels weak and hot and sweaty with each of the episodes. He will feel weak for hours after they occur. At times he can prevent the spells by getting to the ground or sitting quickly. No tongue biting or loss of continence.  No Known Allergies   Current Outpatient Medications  Medication Sig Dispense Refill   ibuprofen (ADVIL) 800 MG tablet Take 1 tablet (800 mg total) by mouth 3 (three) times daily. Take with meals 21 tablet 0   No current facility-administered medications for this visit.     Past Medical History:  Diagnosis Date   Bipolar disorder ([place])    Chest pain    a. calcium score of 0 by Coronary CT in 11/2018   Chronic back pain    Essential hypertension    Migraine    Sciatic pain    Syncope 08/2015   Tobacco abuse     ROS:   All systems reviewed and negative except as noted in the HPI.   Past Surgical History:  Procedure Laterality Date   COLONOSCOPY WITH PROPOFOL N/A 02/01/2019   Procedure: COLONOSCOPY WITH PROPOFOL;  Surgeon: Daneil Dolin, MD;  [facility];  Service: Endoscopy;  Laterality: N/A;  12:00pm - office called and gave pt new arrival time   None     POLYPECTOMY  02/01/2019   Procedure: POLYPECTOMY;  Surgeon: Daneil Dolin, MD;  [facility];  Service: Endoscopy;;  colon     Family History  Problem Relation Age of Onset   Hypertension Mother    Heart disease Mother    Stroke Mother    Diabetes Maternal Aunt    Stroke Maternal Aunt    Cancer Maternal Uncle    Diabetes Maternal Grandmother    Cancer Maternal Grandmother    Diabetes Maternal Grandfather    Cancer Paternal Grandfather       Social History   Socioeconomic History   Marital status: Single    Spouse name: Not on file   Number of children: Not on file   Years of education: Not on file   Highest education level: Not on file  Occupational History   Occupation: [employer]  Tobacco Use   Smoking status: Some Days    Packs/day: 0.50    Years: 4.00    Pack years: 2.00    Types: Cigarettes    Start date: 09/20/1996   Smokeless tobacco: Never  Vaping Use   Vaping Use: Never used  Substance and Sexual Activity   Alcohol use: Yes    Alcohol/week: 0.0 standard drinks    Comment: occ   Drug use: Yes    Types: Marijuana    Comment: daily   Sexual activity: Not on file  Other Topics Concern   Not on file  Social History Narrative   Not on file   Social Determinants of Health   Financial Resource Strain: Not on file  Food Insecurity: Not on file  Transportation Needs: Not on file  Physical Activity: Not on file  Stress: Not on file  Social Connections: Not on file  Intimate Partner Violence: Not on file  BP (!) 146/88   Pulse 62   Ht '6\' 3"'$  (1.905 m)   Wt 210 lb 6.4 oz (95.4 kg)   SpO2 96%   BMI 26.30 kg/m   Physical Exam:  Well appearing NAD HEENT: Unremarkable Neck:  No JVD, no thyromegally Lymphatics:  No adenopathy Back:  No CVA tenderness Lungs:  Clear with no wheezes HEART:  Regular rate rhythm, no murmurs, no rubs, no clicks Abd:  soft, positive bowel sounds, no organomegally, no rebound, no guarding Ext:  2 plus pulses, no edema, no cyanosis, no clubbing Skin:  No rashes no nodules Neuro:  CN II through XII intact, motor grossly intact   Assess/Plan:  Syncope - the etiology of the patient's symptoms are due to autonomic dysfunction. He has both vasomotor instability as well as orthostasis. I discussed the mechanism of the episodes as well as the importance of life style modification and asked him to avoid, caffeine, ETOH and THC. Also encouraged him to not miss meals and to  stay hydrated. HTN -his bp is minimally elevated. We will follow.  Carleene Overlie Beautifull Cisar,MD

## 2022-02-05 NOTE — Patient Instructions (Addendum)
Medication Instructions:  Your physician recommends that you continue on your current medications as directed. Please refer to the Current Medication list given to you today.  Labwork: None ordered.  Testing/Procedures: None ordered.  Follow-Up: Your physician wants you to follow-up with Dr. Harrington Challenger as scheduled.   Any Other Special Instructions Will Be Listed Below (If Applicable).  If you need a refill on your cardiac medications before your next appointment, please call your pharmacy.   Important Information About Sugar

## 2022-03-22 ENCOUNTER — Encounter (HOSPITAL_COMMUNITY): Payer: Self-pay | Admitting: Emergency Medicine

## 2022-03-22 ENCOUNTER — Emergency Department (HOSPITAL_COMMUNITY)
Admission: EM | Admit: 2022-03-22 | Discharge: 2022-03-22 | Disposition: A | Discharge status: Home / Self Care | Payer: Self-pay | Attending: Emergency Medicine | Admitting: Emergency Medicine

## 2022-03-22 ENCOUNTER — Other Ambulatory Visit: Payer: Self-pay

## 2022-03-22 ENCOUNTER — Emergency Department (HOSPITAL_COMMUNITY): Payer: Self-pay

## 2022-03-22 DIAGNOSIS — M25511 Pain in right shoulder: Secondary | ICD-10-CM | POA: Insufficient documentation

## 2022-03-22 DIAGNOSIS — M5410 Radiculopathy, site unspecified: Secondary | ICD-10-CM | POA: Insufficient documentation

## 2022-03-22 MED ORDER — CYCLOBENZAPRINE HCL 5 MG PO TABS: 5.0000 mg | ORAL_TABLET | Freq: Every day | ORAL | 0 refills | Status: DC

## 2022-03-22 MED ORDER — HYDROCODONE-ACETAMINOPHEN 5-325 MG PO TABS
1.0000 | ORAL_TABLET | Freq: Once | ORAL | Status: AC
  Administered 2022-03-22: 1 via ORAL
  Filled 2022-03-22: qty 1

## 2022-03-22 NOTE — ED Provider Notes (Signed)
[facility] Provider Note   CSN: [id number] Arrival date & time: 03/22/22  0028     History  Chief Complaint  Patient presents with   Shoulder Pain    Maxwell White is a 46 y.o. male.  The history is provided by the patient.  Shoulder Pain Location:  Shoulder Shoulder location:  R shoulder Patient presents with right shoulder pain that he has had ongoing for at least a month the pain is around his shoulder and into his axilla.  No recent trauma.  No overhead activity.  At times he will have numbness that shoots down from his right shoulder into his arm.  No focal weakness.  He also reports occasional right neck pain.  No other new complaints.  No weakness or numbness in his other extremities.     Home Medications Prior to Admission medications   Medication Sig Start Date End Date Taking? Authorizing Provider  cyclobenzaprine (FLEXERIL) 5 MG tablet Take 1 tablet (5 mg total) by mouth at bedtime. 03/22/22  Yes Ripley Fraise, MD  ibuprofen (ADVIL) 800 MG tablet Take 1 tablet (800 mg total) by mouth 3 (three) times daily. Take with meals 07/06/21   Jeanell Sparrow, DO      Allergies    Patient has no known allergies.    Review of Systems   Review of Systems  Musculoskeletal:  Positive for arthralgias.  Neurological:  Positive for numbness.    Physical Exam Updated Vital Signs BP 112/84   Pulse 63   Temp 98.3 F (36.8 C) (Oral)   Resp 16   Ht 1.905 m ('6\' 3"'$ )   Wt 99.8 kg   SpO2 92%   BMI 27.50 kg/m  Physical Exam CONSTITUTIONAL: Well developed/well nourished, no distress HEAD: Normocephalic/atraumatic EYES: EOMI/PERRL ENMT: Mucous membranes moist NECK: supple no meningeal signs SPINE/BACK:entire spine nontender CV: S1/S2 noted, no murmurs/rubs/gallops noted LUNGS: Lungs are clear to auscultation bilaterally, no apparent distress ABDOMEN: soft, nontender, no rebound or guarding, bowel sounds noted throughout abdomen GU:no cva  tenderness NEURO: Pt is awake/alert/appropriate, moves all extremitiesx4.  No facial droop.   Equal power (5/5) with hand grip, wrist flex/extension, elbow flex/extension, and equal power with shoulder abduction/adduction.   Equal (2+) biceps/brachioradialis reflex in bilateral UE Patient reports numbness throughout the right upper extremity EXTREMITIES: pulses normal/equal, full ROM, mild tenderness noted to right shoulder, no deformity, no bruising, no erythema There is no axillary lymphadenopathy. SKIN: warm, color normal, no erythema or rashes noted to the right shoulder or the chest PSYCH: no abnormalities of mood noted, alert and oriented to situation  ED Results / Procedures / Treatments   Labs (all labs ordered are listed, but only abnormal results are displayed) Labs Reviewed - No data to display  EKG None  Radiology DG Shoulder Right  Result Date: 03/22/2022 CLINICAL DATA:  Right shoulder pain. EXAM: RIGHT SHOULDER - 2+ VIEW COMPARISON:  None Available. FINDINGS: There is no evidence of fracture or dislocation. There is no evidence of arthropathy or other focal bone abnormality. Soft tissues are unremarkable. IMPRESSION: Negative. Electronically Signed   By: Virgina Norfolk M.D.   On: 03/22/2022 01:50    Procedures Procedures    Medications Ordered in ED Medications  HYDROcodone-acetaminophen (NORCO/VICODIN) 5-325 MG per tablet 1 tablet (1 tablet Oral Given 03/22/22 0246)    ED Course/ Medical Decision Making/ A&P  Medical Decision Making Risk Prescription drug management.   Patient presents with right shoulder pain he has had for up to a month.  He also reports numbness throughout the right arm at times At times he also has neck pain.  There is no focal weakness.  No signs of CVA.  Suspect this is a radiculopathy or potentially shoulder impingement syndrome/brachial plexopathy   We will provide sling for comfort, referred to Ortho, short  course of muscle relaxant        Final Clinical Impression(s) / ED Diagnoses Final diagnoses:  Acute pain of right shoulder  Radiculopathy, unspecified spinal region    Rx / DC Orders ED Discharge Orders          Ordered    cyclobenzaprine (FLEXERIL) 5 MG tablet  Daily at bedtime        03/22/22 0322              Ripley Fraise, MD 03/22/22 [phone]

## 2022-03-22 NOTE — ED Triage Notes (Signed)
Pt with c/o R shoulder pain for a few months with numbness and tingling to his R arm. Pt has tried OTC pain medication without relief.

## 2022-05-14 ENCOUNTER — Ambulatory Visit: Payer: Self-pay | Admitting: Orthopedic Surgery

## 2022-08-05 NOTE — Progress Notes (Signed)
Cardiology Office Note   Date:  08/06/2022   ID:  Maxwell White, DOB [birth year], MRN [id number]  PCP:  Pcp, No  Cardiologist:   Dorris Carnes, MD    Pt presents for f/u of dizziness and syncope   History of Present Illness: Maxwell White is a 46 y.o. male with a history of syncope and collapse Possibly due to orthostasis.  Echo showed LVEF 55 to 60% Event monitor unremarkable Had funny feeling in chest in March 2020   Seen in [facility] ED   Coronary CT ordered    Calcium score was 0  Pt had colonoscopy done and HR dropped  Set up for event monitior negatvie       I saw the pt in clinic in 2021   He has been seen by B Ahmed Prima and Beckie Salts since  Felt vasomotor syncope  The pt was admitted on Nov 8 to [place] for syncope   Spent 2 nights.    He says the day prior at work he started to feel bad   Not right    Nauseated, tired.    Went home   Ate dinner and went to bed    Slept til the next day   Still not right    Called out of work    Laid back down     Woke up later   Woke up hungry   Went to [employer]     The resit he doesn't remember   Got up   Went to [employer] again.   Got up   Sat in chair   Went to door   Collapsed again.   Faimy called EMS    Taken to [facility]   CTs of head done    BP readings     Given fluids   Sent home for follow up  Since then he has had no further spells    Has felt a little weaker over past month or two    No outpatient medications have been marked as taking for the 08/06/22 encounter (Office Visit) with Fay Records, MD.     Allergies:   Patient has no known allergies.   Past Medical History:  Diagnosis Date   Bipolar disorder ([place])    Chest pain    a. calcium score of 0 by Coronary CT in 11/2018   Chronic back pain    Essential hypertension    Migraine    Sciatic pain    Syncope 08/2015   Tobacco abuse     Past Surgical History:  Procedure Laterality Date   COLONOSCOPY WITH PROPOFOL N/A 02/01/2019   Procedure:  COLONOSCOPY WITH PROPOFOL;  Surgeon: Daneil Dolin, MD;  [facility];  Service: Endoscopy;  Laterality: N/A;  12:00pm - office called and gave pt new arrival time   None     POLYPECTOMY  02/01/2019   Procedure: POLYPECTOMY;  Surgeon: Daneil Dolin, MD;  [facility];  Service: Endoscopy;;  colon     Social History:  The patient  reports that he has been smoking cigarettes. He started smoking about 25 years ago. He has a 2.00 pack-year smoking history. He has never used smokeless tobacco. He reports current alcohol use. He reports current drug use. Drug: Marijuana.   Family History:  The patient's family history includes Cancer in his maternal grandmother, maternal uncle, and paternal grandfather; Diabetes in his maternal aunt, maternal grandfather,  and maternal grandmother; Heart disease in his mother; Hypertension in his mother; Stroke in his maternal aunt and mother.    ROS:  Please see the history of present illness. All other systems are reviewed and  Negative to the above problem except as noted.    PHYSICAL EXAM: VS:  BP 122/84   Pulse 83   Ht '6\' 3"'$  (1.905 m)   Wt 214 lb (97.1 kg)   SpO2 96%   BMI 26.75 kg/m    Laying   BP 133/ 91   P 66  Sitting  134/91  P 69   Standing   139/95  P 73   Standing 4 min 141/94  P 78    GEN: Well nourished, well developed, in no acute distress  HEENT: normal  Neck: no JVD, carotid bruits Cardiac: RRR; no murmurs, rubs, or gallops,no edema  Respiratory:  clear to auscultation bilaterally,  GI: soft, nontender, nondistended, + BS  No hepatomegaly  MS: no deformity Moving all extremities   Skin: warm and dry, no rash Neuro:  Strength and sensation are intact Psych: euthymic mood, full affect   EKG:  EKG is not ordered today.   Lipid Panel    Component Value Date/Time   CHOL 184 11/25/2016 1540   TRIG 243 (H) 11/25/2016 1540   HDL 58 11/25/2016 1540   CHOLHDL 3.2 11/25/2016 1540   VLDL 49 (H) 11/25/2016 1540    LDLCALC 77 11/25/2016 1540      Wt Readings from Last 3 Encounters:  08/06/22 214 lb (97.1 kg)  03/22/22 220 lb (99.8 kg)  02/05/22 210 lb 6.4 oz (95.4 kg)      ASSESSMENT AND PLAN:  1  Dizziness./ syncope   Pt with recent spells (4 in one day)   Evaluated at [place] exam today he is not orthostatic     Hx sugg of vasodepressor syncope      Stay hydrated   Stay active    Compression hose  Consider abdominal binder, esp when feeling bad      Reviewed with EP   Recomm hydrate   Stay active  Pt's work includes using forklift   2  HTN   BP is mildly elevated on orthostatic evaluation    No Rx for now given syncope.   3   Tob  Still smoking aobut 1/2 ppd.  Pt counselled on cessation      Current medicines are reviewed at length with the patient today.  The patient does not have concerns regarding medicines.  Signed, Dorris Carnes, MD  08/06/2022 9:02 AM    [address] [place], [place], [place]  [address] Phone: [phone]; Fax: [phone]

## 2022-08-06 ENCOUNTER — Encounter: Payer: Self-pay | Admitting: Internal Medicine

## 2022-08-06 ENCOUNTER — Encounter: Payer: Self-pay | Admitting: *Deleted

## 2022-08-06 ENCOUNTER — Ambulatory Visit: Payer: Self-pay | Attending: Internal Medicine | Admitting: Internal Medicine

## 2022-08-06 VITALS — BP 122/84 | HR 83 | Ht 75.0 in | Wt 214.0 lb

## 2022-08-06 DIAGNOSIS — R55 Syncope and collapse: Secondary | ICD-10-CM

## 2022-08-06 NOTE — Patient Instructions (Signed)
Medication Instructions:  Your physician recommends that you continue on your current medications as directed. Please refer to the Current Medication list given to you today.  *If you need a refill on your cardiac medications before your next appointment, please call your pharmacy*   Lab Work: NONE   If you have labs (blood work) drawn today and your tests are completely normal, you will receive your results only by: [facility] (if you have MyChart) OR A paper copy in the mail If you have any lab test that is abnormal or we need to change your treatment, we will call you to review the results.   Testing/Procedures: NONE    Follow-Up: At [facility], you and your health needs are our priority.  As part of our continuing mission to provide you with exceptional heart care, we have created designated Provider Care Teams.  These Care Teams include your primary Cardiologist (physician) and Advanced Practice Providers (APPs -  Physician Assistants and Nurse Practitioners) who all work together to provide you with the care you need, when you need it.  We recommend signing up for the patient portal called "MyChart".  Sign up information is provided on this After Visit Summary.  MyChart is used to connect with patients for Virtual Visits (Telemedicine).  Patients are able to view lab/test results, encounter notes, upcoming appointments, etc.  Non-urgent messages can be sent to your provider as well.   To learn more about what you can do with MyChart, go to [email or web address].    Your next appointment:    To Be Determined   The format for your next appointment:   In Person  Provider:   Dorris Carnes, MD    Other Instructions Thank you for choosing [facility]!    Important Information About [place]

## 2022-08-17 ENCOUNTER — Telehealth: Payer: Self-pay | Admitting: Internal Medicine

## 2022-08-17 NOTE — Telephone Encounter (Signed)
Reviewed with EP I would recomm 24 hour urine sodium to see how his body is handling sodium   DO over weekend Push sodium and fluids

## 2022-08-25 NOTE — Telephone Encounter (Signed)
Not able to leave vmail on pt's phone

## 2022-08-27 NOTE — Telephone Encounter (Signed)
Called pt no answer, no voicemail.

## 2022-08-30 ENCOUNTER — Telehealth: Payer: Self-pay | Admitting: Internal Medicine

## 2022-08-30 DIAGNOSIS — R55 Syncope and collapse: Secondary | ICD-10-CM

## 2022-08-30 DIAGNOSIS — R42 Dizziness and giddiness: Secondary | ICD-10-CM

## 2022-08-30 NOTE — Telephone Encounter (Signed)
Left a message for patient to call office back .  

## 2022-08-30 NOTE — Telephone Encounter (Signed)
Pt calling back from last week, he states he has a new phone number  Best c/b: [phone]

## 2022-08-30 NOTE — Telephone Encounter (Signed)
Pt is returning call. Requesting call back.  

## 2022-08-30 NOTE — Telephone Encounter (Signed)
Fay Records, MD     08/17/22  9:52 PM Note Reviewed with EP I would recomm 24 hour urine sodium to see how his body is handling sodium   DO over weekend Push sodium and fluids

## 2022-09-01 NOTE — Telephone Encounter (Signed)
Returned call to pt. No answer. Left msg to call back.  

## 2022-09-02 NOTE — Telephone Encounter (Signed)
Returned call to pt. No answer. Left msg to call back.  

## 2022-09-03 ENCOUNTER — Other Ambulatory Visit: Payer: Self-pay

## 2022-09-03 DIAGNOSIS — R55 Syncope and collapse: Secondary | ICD-10-CM

## 2022-09-03 DIAGNOSIS — R42 Dizziness and giddiness: Secondary | ICD-10-CM

## 2022-09-03 NOTE — Telephone Encounter (Signed)
Left message with male at [phone] [facility]) *Preferred* asking that patient return call.

## 2022-09-03 NOTE — Telephone Encounter (Signed)
Patient called back and he will stop by and pick up lab slip for [facility] for 24 hr sodium.

## 2022-10-14 LAB — SODIUM, URINE, 24 HOUR
Sodium, 24H Ur: 73 mmol/24 hr (ref 58–337)
Sodium, Ur: 56 mmol/L

## 2023-01-12 ENCOUNTER — Encounter: Payer: Self-pay | Admitting: Internal Medicine

## 2023-01-12 ENCOUNTER — Ambulatory Visit (INDEPENDENT_AMBULATORY_CARE_PROVIDER_SITE_OTHER): Payer: Medicaid Other | Admitting: Internal Medicine

## 2023-01-12 VITALS — BP 136/96 | HR 71 | Ht 75.0 in | Wt 214.1 lb

## 2023-01-12 DIAGNOSIS — I10 Essential (primary) hypertension: Secondary | ICD-10-CM | POA: Diagnosis not present

## 2023-01-12 DIAGNOSIS — Z0001 Encounter for general adult medical examination with abnormal findings: Secondary | ICD-10-CM | POA: Diagnosis not present

## 2023-01-12 DIAGNOSIS — Z720 Tobacco use: Secondary | ICD-10-CM | POA: Diagnosis not present

## 2023-01-12 DIAGNOSIS — Z87898 Personal history of other specified conditions: Secondary | ICD-10-CM

## 2023-01-12 MED ORDER — NICOTINE 14 MG/24HR TD PT24
14.0000 mg | MEDICATED_PATCH | Freq: Every day | TRANSDERMAL | 1 refills | Status: AC
Start: 2023-01-12 — End: ?

## 2023-01-12 MED ORDER — VARENICLINE TARTRATE (STARTER) 0.5 MG X 11 & 1 MG X 42 PO TBPK: ORAL_TABLET | ORAL | 0 refills | Status: AC

## 2023-01-12 NOTE — Progress Notes (Addendum)
HPI:Maxwell White is a 47 y.o. 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 He has history of bipolar disorder, but question if he only has anxiety. He is not having any difficulty in his daily life due to depression or anxiety. He has not been on treatment or following with psychiatry. He does not have any feeling of depression or anxiety today. Cardiology has worked up syncope and he is following with his primary cardiologist , Dr.Ross. He has occasional back pain, but not bothering him daily. He would like to quit smoking.Smoking half pack of cigarettes daily. Has been unsuccessful when use nicotine patches alone.  For the details of today's visit, please refer to the assessment and plan.  Past Medical History:  Diagnosis Date   Bipolar disorder    Chest pain    a. calcium score of 0 by Coronary CT in 11/2018   Chronic back pain    Essential hypertension    Migraine    Sciatic pain    Syncope 08/2015   Tobacco abuse     Past Surgical History:  Procedure Laterality Date   COLONOSCOPY WITH PROPOFOL N/A 02/01/2019   Procedure: COLONOSCOPY WITH PROPOFOL;  Surgeon: Corbin Ade, MD;  Location: [facility];  Service: Endoscopy;  Laterality: N/A;  12:00pm - office called and gave pt new arrival time   None     POLYPECTOMY  02/01/2019   Procedure: POLYPECTOMY;  Surgeon: Corbin Ade, MD;  Location: [facility];  Service: Endoscopy;;  colon    Family History  Problem Relation Age of Onset   Hypertension Mother    Heart disease Mother    Stroke Mother    Diabetes Maternal Aunt    Stroke Maternal Aunt    Cancer Maternal Uncle    Diabetes Maternal Grandmother    Cancer Maternal Grandmother    Diabetes Maternal Grandfather     Cancer Paternal Grandfather     Social History   Tobacco Use   Smoking status: Some Days    Packs/day: 0.50    Years: 26.00    Additional pack years: 0.00    Total pack years: 13.00    Types: Cigarettes    Start date: 09/20/1996   Smokeless tobacco: Never  Vaping Use   Vaping Use: Never used  Substance Use Topics   Alcohol use: Yes    Alcohol/week: 0.0 standard drinks of alcohol    Comment: occ   Drug use: Yes    Types: Marijuana    Comment: daily     Physical Exam: Vitals:   01/12/23 0821  BP: (!) 136/96  Pulse: 71  SpO2: 97%  Weight: 214 lb 1.9 oz (97.1 kg)  Height:  (1.905 m)     Physical Exam Constitutional:      General: He is not in acute distress.    Appearance: He is well-developed. He is not ill-appearing.  Eyes:     General: No scleral icterus.    Conjunctiva/sclera: Conjunctivae normal.  Cardiovascular:     Rate and Rhythm: Normal rate and regular rhythm.     Heart sounds: No murmur heard.    No friction rub. No gallop.  Pulmonary:     Effort: Pulmonary effort is normal.     Breath  sounds: No wheezing, rhonchi or rales.  Musculoskeletal:     Right lower leg: No edema.     Left lower leg: No edema.  Skin:    General: Skin is warm and dry.      Assessment & Plan:   History of syncope due to autonomic dysfuction Patient has followed with cardiology for multiple years and had workup of syncope. He has had syncope since a teenager. Review of notes show this is from autonomic instability and patient has been given recommendations to follow from cardiology.   Essential hypertension Patient's BP is 136/96. Reports drops in his blood pressure causing hypotension when on antihypertensive. Has history of autonomic instability causing syncope. Will continue to monitor.   Tobacco abuse Patient is smoking 1/2 ppd for 26 years.  He is ready to quit.  Start Chantix Start nicotine patch 14 mg for 6 weeks  Follow up for monitoring in one month and  will need step down prescription at that time.  Encounter for general adult medical examination with abnormal findings Establishing care with our clinic today. Will check baseline labs.     Milus Banister, MD

## 2023-01-12 NOTE — Patient Instructions (Signed)
Thank you, Mr.Ysmael R Keatts for allowing [place] to provide your care today.   I have ordered the following labs for you:   Lab Orders         Lipid panel         CMP14+EGFR         CBC with Differential/Platelet         TSH         Hemoglobin A1c         Vitamin B1         Vitamin D (25 hydroxy)       Medications sent to pharmacy.  Reminders: Follow up in one month.     Thurmon Fair, M.D.

## 2023-01-12 NOTE — Assessment & Plan Note (Addendum)
Patient has followed with cardiology for multiple years and had workup of syncope. He has had syncope since a teenager. Review of notes show this is from autonomic instability and patient has been given recommendations to follow from cardiology.

## 2023-01-12 NOTE — Assessment & Plan Note (Signed)
Establishing care with our clinic today. Will check baseline labs.

## 2023-01-12 NOTE — Assessment & Plan Note (Signed)
Patient's BP is 136/96. Reports drops in his blood pressure causing hypotension when on antihypertensive. Has history of autonomic instability causing syncope. Will continue to monitor.

## 2023-01-12 NOTE — Assessment & Plan Note (Signed)
Patient is smoking 1/2 ppd for 26 years.  He is ready to quit.  Start Chantix Start nicotine patch 14 mg for 6 weeks  Follow up for monitoring in one month and will need step down prescription at that time.

## 2023-01-15 LAB — CBC WITH DIFFERENTIAL/PLATELET
Basophils Absolute: 0.1 10*3/uL (ref 0.0–0.2)
Basos: 1 %
EOS (ABSOLUTE): 0.1 10*3/uL (ref 0.0–0.4)
Eos: 1 %
Hematocrit: 43.2 % (ref 37.5–51.0)
Hemoglobin: 14.4 g/dL (ref 13.0–17.7)
Immature Grans (Abs): 0 10*3/uL (ref 0.0–0.1)
Immature Granulocytes: 0 %
Lymphocytes Absolute: 3 10*3/uL (ref 0.7–3.1)
Lymphs: 34 %
MCH: 30.4 pg (ref 26.6–33.0)
MCHC: 33.3 g/dL (ref 31.5–35.7)
MCV: 91 fL (ref 79–97)
Monocytes Absolute: 0.5 10*3/uL (ref 0.1–0.9)
Monocytes: 6 %
Neutrophils Absolute: 5.1 10*3/uL (ref 1.4–7.0)
Neutrophils: 58 %
Platelets: 288 10*3/uL (ref 150–450)
RBC: 4.74 x10E6/uL (ref 4.14–5.80)
RDW: 12.9 % (ref 11.6–15.4)
WBC: 8.8 10*3/uL (ref 3.4–10.8)

## 2023-01-15 LAB — CMP14+EGFR
ALT: 12 IU/L (ref 0–44)
AST: 14 IU/L (ref 0–40)
Albumin/Globulin Ratio: 1.9 (ref 1.2–2.2)
Albumin: 4.4 g/dL (ref 4.1–5.1)
Alkaline Phosphatase: 57 IU/L (ref 44–121)
BUN/Creatinine Ratio: 8 — ABNORMAL LOW (ref 9–20)
BUN: 9 mg/dL (ref 6–24)
Bilirubin Total: 0.5 mg/dL (ref 0.0–1.2)
CO2: 17 mmol/L — ABNORMAL LOW (ref 20–29)
Calcium: 9 mg/dL (ref 8.7–10.2)
Chloride: 105 mmol/L (ref 96–106)
Creatinine, Ser: 1.13 mg/dL (ref 0.76–1.27)
Globulin, Total: 2.3 g/dL (ref 1.5–4.5)
Glucose: 73 mg/dL (ref 70–99)
Potassium: 4 mmol/L (ref 3.5–5.2)
Sodium: 140 mmol/L (ref 134–144)
Total Protein: 6.7 g/dL (ref 6.0–8.5)
eGFR: 82 mL/min/{1.73_m2} (ref >59)

## 2023-01-15 LAB — LIPID PANEL
Chol/HDL Ratio: 2.9 ratio (ref 0.0–5.0)
Cholesterol, Total: 173 mg/dL (ref 100–199)
HDL: 60 mg/dL (ref >39)
LDL Chol Calc (NIH): 99 mg/dL (ref 0–99)
Triglycerides: 73 mg/dL (ref 0–149)
VLDL Cholesterol Cal: 14 mg/dL (ref 5–40)

## 2023-01-15 LAB — HEMOGLOBIN A1C
Est. average glucose Bld gHb Est-mCnc: 105 mg/dL
Hgb A1c MFr Bld: 5.3 % (ref 4.8–5.6)

## 2023-01-15 LAB — VITAMIN B1: Thiamine: 103.4 nmol/L (ref 66.5–200.0)

## 2023-01-15 LAB — TSH: TSH: 1.2 u[IU]/mL (ref 0.450–4.500)

## 2023-01-15 LAB — VITAMIN D 25 HYDROXY (VIT D DEFICIENCY, FRACTURES): Vit D, 25-Hydroxy: 16.4 ng/mL — ABNORMAL LOW (ref 30.0–100.0)

## 2023-01-17 ENCOUNTER — Other Ambulatory Visit: Payer: Self-pay | Admitting: Internal Medicine

## 2023-01-17 DIAGNOSIS — E559 Vitamin D deficiency, unspecified: Secondary | ICD-10-CM

## 2023-01-17 MED ORDER — VITAMIN D3 25 MCG (1000 UT) PO CAPS: 1000.0000 [IU] | ORAL_CAPSULE | Freq: Every day | ORAL | 1 refills | Status: AC

## 2023-04-13 ENCOUNTER — Ambulatory Visit: Payer: Medicaid Other | Admitting: Internal Medicine

## 2024-02-01 ENCOUNTER — Encounter: Payer: Self-pay | Admitting: *Deleted

## 2024-02-14 ENCOUNTER — Emergency Department (HOSPITAL_COMMUNITY)
Admission: EM | Admit: 2024-02-14 | Discharge: 2024-02-14 | Discharge status: Against Medical Advice | Payer: MEDICAID | Attending: Emergency Medicine | Admitting: Emergency Medicine

## 2024-02-14 ENCOUNTER — Other Ambulatory Visit: Payer: Self-pay

## 2024-02-14 DIAGNOSIS — Z5321 Procedure and treatment not carried out due to patient leaving prior to being seen by health care provider: Secondary | ICD-10-CM | POA: Insufficient documentation

## 2024-02-14 DIAGNOSIS — R600 Localized edema: Secondary | ICD-10-CM | POA: Insufficient documentation

## 2024-02-14 DIAGNOSIS — R200 Anesthesia of skin: Secondary | ICD-10-CM | POA: Diagnosis not present

## 2024-02-14 DIAGNOSIS — R202 Paresthesia of skin: Secondary | ICD-10-CM | POA: Diagnosis not present

## 2024-02-14 DIAGNOSIS — M79604 Pain in right leg: Secondary | ICD-10-CM | POA: Insufficient documentation

## 2024-02-14 NOTE — ED Triage Notes (Signed)
 Pt from home complains of R leg pain and swelling for about a month, endorses intermittent numbness and tingling. Pt has "knot" on back of right leg. Denies SOB and CP. Pt is ambulatory and AAOx4, Mild edema non pitting.

## 2024-02-14 NOTE — ED Notes (Signed)
Called 1x

## 2024-02-14 NOTE — ED Notes (Addendum)
 Called [phone] for rooming

## 2024-06-04 ENCOUNTER — Emergency Department (HOSPITAL_COMMUNITY)
Admission: EM | Admit: 2024-06-04 | Discharge: 2024-06-04 | Disposition: A | Discharge status: Home / Self Care | Payer: MEDICAID | Attending: Emergency Medicine | Admitting: Emergency Medicine

## 2024-06-04 ENCOUNTER — Emergency Department (HOSPITAL_COMMUNITY): Payer: MEDICAID

## 2024-06-04 ENCOUNTER — Other Ambulatory Visit: Payer: Self-pay

## 2024-06-04 ENCOUNTER — Encounter (HOSPITAL_COMMUNITY): Payer: Self-pay

## 2024-06-04 DIAGNOSIS — R202 Paresthesia of skin: Secondary | ICD-10-CM | POA: Diagnosis not present

## 2024-06-04 DIAGNOSIS — M25511 Pain in right shoulder: Secondary | ICD-10-CM | POA: Insufficient documentation

## 2024-06-04 MED ORDER — METHOCARBAMOL 750 MG PO TABS: 750.0000 mg | ORAL_TABLET | Freq: Three times a day (TID) | ORAL | 0 refills | Status: AC

## 2024-06-04 MED ORDER — DICLOFENAC SODIUM 75 MG PO TBEC: 75.0000 mg | DELAYED_RELEASE_TABLET | Freq: Two times a day (BID) | ORAL | 0 refills | Status: AC

## 2024-06-04 NOTE — ED Triage Notes (Addendum)
 Pt presents to ED with complaints of  right shoulder pain x 1 week. Pt states he injured his rotator cuff a few years back and now he thinks it has flared up.

## 2024-06-04 NOTE — ED Provider Notes (Signed)
 [place] EMERGENCY DEPARTMENT AT [facility] Provider Note   CSN: [id number] Arrival date & time: 06/04/24  9081     Patient presents with: Shoulder Pain   Maxwell White is a 48 y.o. male.   Patient is a 48 year old male who presents to the emergency department the chief complaint of right-sided shoulder pain which has been ongoing for approximate the past week.  Patient does have a history of previous rotator cuff injury and notes that he is unsure if he may have done something to exacerbate the old injury.  Patient notes that the pain is worse with certain movements and improves with rest.  He notes he does get some intermittent paresthesias down the right arm.  He denies any pain to his neck or back.  He has had no recent falls or blunt trauma.  He denies any overlying erythema, warmth to the shoulder.  He has had no associated fever or chills.  He has had no pain to the chest or associated shortness of breath.   Shoulder Pain      Prior to Admission medications   Medication Sig Start Date End Date Taking? Authorizing Provider  diclofenac  (VOLTAREN ) 75 MG EC tablet Take 1 tablet (75 mg total) by mouth 2 (two) times daily. 06/04/24  Yes Daralene Bruckner D, PA-C  methocarbamol  (ROBAXIN ) 750 MG tablet Take 1 tablet (750 mg total) by mouth 3 (three) times daily. 06/04/24  Yes Daralene Bruckner BIRCH, PA-C  Cholecalciferol (VITAMIN D3) 25 MCG (1000 UT) CAPS Take 1 capsule (1,000 Units total) by mouth daily. 01/17/23   Golda Lynwood PARAS, MD  nicotine  (NICODERM CQ  - DOSED IN MG/24 HOURS) 14 mg/24hr patch Place 1 patch (14 mg total) onto the skin daily. 01/12/23   Golda Lynwood PARAS, MD  Varenicline  Tartrate, Starter, (CHANTIX  STARTING MONTH PAK) 0.5 MG X 11 & 1 MG X 42 TBPK Days 1 to 3: 0.5 mg once daily. Days 4 to 7: 0.5 mg twice daily. Then 1 mg twice daily 01/12/23   Steen, James J, MD    Allergies: Patient has no known allergies.    Review of Systems  Musculoskeletal:         Right-sided shoulder pain  All other systems reviewed and are negative.   Updated Vital Signs BP (!) 172/103 (BP Location: Right Arm)   Pulse 68   Temp 98.9 F (37.2 C) (Oral)   Resp 19   Ht 6' 3 (1.905 m)   Wt 93.4 kg   SpO2 100%   BMI 25.75 kg/m   Physical Exam Vitals and nursing note reviewed.  Constitutional:      General: He is not in acute distress.    Appearance: Normal appearance. He is not ill-appearing.  HENT:     Head: Normocephalic and atraumatic.     Nose: Nose normal.     Mouth/Throat:     Mouth: Mucous membranes are moist.  Eyes:     Extraocular Movements: Extraocular movements intact.     Conjunctiva/sclera: Conjunctivae normal.     Pupils: Pupils are equal, round, and reactive to light.  Cardiovascular:     Rate and Rhythm: Normal rate and regular rhythm.     Pulses: Normal pulses.     Heart sounds: Normal heart sounds. No murmur heard.    No gallop.  Pulmonary:     Effort: Pulmonary effort is normal. No respiratory distress.     Breath sounds: Normal breath sounds. No stridor. No wheezing, rhonchi or  rales.  Musculoskeletal:        General: Normal range of motion.     Cervical back: Normal range of motion and neck supple.     Comments: Nontender palpation of her right upper extremity diffusely, radial pulse 2+ distally, cap refill less than 2 seconds distally, sensation intact distally, full range of motion noted throughout, no swelling, erythema or warmth of the right shoulder, no pain with passive range of motion, radial, ulnar, median, axillary nerve function intact distally, no obvious deformity or bruising, no skin breakdown or ulceration, no lacerations or abrasions  Skin:    General: Skin is warm and dry.  Neurological:     General: No focal deficit present.     Mental Status: He is alert and oriented to person, place, and time. Mental status is at baseline.     Cranial Nerves: No cranial nerve deficit.     Sensory: No sensory deficit.      Motor: No weakness.     Coordination: Coordination normal.     Gait: Gait normal.  Psychiatric:        Mood and Affect: Mood normal.        Behavior: Behavior normal.        Thought Content: Thought content normal.        Judgment: Judgment normal.     (all labs ordered are listed, but only abnormal results are displayed) Labs Reviewed - No data to display  EKG: None  Radiology: DG Shoulder Right Result Date: 06/04/2024 CLINICAL DATA:  Right shoulder pain for 1 week. History of rotator cuff injury years ago. EXAM: RIGHT SHOULDER - 2+ VIEW COMPARISON:  Radiographs 03/22/2022 FINDINGS: The mineralization and alignment are normal. There is no evidence of acute fracture or dislocation. Stable mild acromioclavicular degenerative changes. No significant glenohumeral arthropathy. No focal soft tissue abnormalities are identified. IMPRESSION: No acute osseous findings. Stable mild acromioclavicular degenerative changes. Electronically Signed   By: Elsie Perone M.D.   On: 06/04/2024 10:23     Procedures   Medications Ordered in the ED - No data to display                                  Medical Decision Making Patient is doing well at this time and is stable for discharge home.  Discussed with patient as x-rays were overall unremarkable.  Low suspicion for underlying etiology such as septic joint.  Patient has intact peripheral pulses and intact neurofunction distally.  Do not suspect acute arterial occlusion.  He has no tenderness over cervical or thoracic spine.  Patient has full range of motion actively and passively.  Pain is reproducible with certain movements.  Low suspicion for underlying cardiac or pulmonary etiology of his symptoms.  Discussed with patient we will continue symptomatic treatment on outpatient basis and recommend close follow-up with orthopedics.  Strict turn precautions were provided for any new or worsening symptoms.  Patient voiced understanding and had no  additional questions.  Risk Prescription drug management.        Final diagnoses:  Acute pain of right shoulder    ED Discharge Orders          Ordered    diclofenac  (VOLTAREN ) 75 MG EC tablet  2 times daily        06/04/24 1102    methocarbamol  (ROBAXIN ) 750 MG tablet  3 times daily  06/04/24 1102               Daralene Lonni BIRCH, PA-C 06/04/24 1104    Charlyn Sora, MD 06/05/24 1515

## 2024-06-04 NOTE — Discharge Instructions (Signed)
 Please take all medications as directed.  Do not take Motrin , ibuprofen , Aleve , naproxen  with the medication you are prescribed.  Please follow-up closely with orthopedics on an outpatient basis.  Return to emergency department immediately for any new or worsening symptoms.

## 2024-06-05 ENCOUNTER — Other Ambulatory Visit: Payer: Self-pay

## 2024-06-05 ENCOUNTER — Encounter (HOSPITAL_COMMUNITY): Payer: Self-pay | Admitting: Emergency Medicine

## 2024-06-05 ENCOUNTER — Emergency Department (HOSPITAL_COMMUNITY)
Admission: EM | Admit: 2024-06-05 | Discharge: 2024-06-05 | Disposition: A | Discharge status: Home / Self Care | Payer: MEDICAID | Attending: Emergency Medicine | Admitting: Emergency Medicine

## 2024-06-05 DIAGNOSIS — R0981 Nasal congestion: Secondary | ICD-10-CM | POA: Insufficient documentation

## 2024-06-05 DIAGNOSIS — I10 Essential (primary) hypertension: Secondary | ICD-10-CM | POA: Diagnosis present

## 2024-06-05 DIAGNOSIS — M25511 Pain in right shoulder: Secondary | ICD-10-CM | POA: Diagnosis not present

## 2024-06-05 LAB — BASIC METABOLIC PANEL WITH GFR
Anion gap: 12 (ref 5–15)
BUN: 12 mg/dL (ref 6–20)
CO2: 23 mmol/L (ref 22–32)
Calcium: 8.9 mg/dL (ref 8.9–10.3)
Chloride: 105 mmol/L (ref 98–111)
Creatinine, Ser: 0.99 mg/dL (ref 0.61–1.24)
GFR, Estimated: >60 mL/min (ref >60)
Glucose, Bld: 79 mg/dL (ref 70–99)
Potassium: 3.9 mmol/L (ref 3.5–5.1)
Sodium: 140 mmol/L (ref 135–145)

## 2024-06-05 LAB — RESP PANEL BY RT-PCR (RSV, FLU A&B, COVID)  RVPGX2
Influenza A by PCR: NEGATIVE
Influenza B by PCR: NEGATIVE
Resp Syncytial Virus by PCR: NEGATIVE
SARS Coronavirus 2 by RT PCR: NEGATIVE

## 2024-06-05 LAB — CBC WITH DIFFERENTIAL/PLATELET
Abs Immature Granulocytes: 0.02 K/uL (ref 0.00–0.07)
Basophils Absolute: 0 K/uL (ref 0.0–0.1)
Basophils Relative: 1 %
Eosinophils Absolute: 0.2 K/uL (ref 0.0–0.5)
Eosinophils Relative: 2 %
HCT: 43.2 % (ref 39.0–52.0)
Hemoglobin: 14 g/dL (ref 13.0–17.0)
Immature Granulocytes: 0 %
Lymphocytes Relative: 33 %
Lymphs Abs: 2.8 K/uL (ref 0.7–4.0)
MCH: 30.5 pg (ref 26.0–34.0)
MCHC: 32.4 g/dL (ref 30.0–36.0)
MCV: 94.1 fL (ref 80.0–100.0)
Monocytes Absolute: 0.7 K/uL (ref 0.1–1.0)
Monocytes Relative: 8 %
Neutro Abs: 4.8 K/uL (ref 1.7–7.7)
Neutrophils Relative %: 56 %
Platelets: 271 K/uL (ref 150–400)
RBC: 4.59 MIL/uL (ref 4.22–5.81)
RDW: 13.5 % (ref 11.5–15.5)
WBC: 8.5 K/uL (ref 4.0–10.5)
nRBC: 0 % (ref 0.0–0.2)

## 2024-06-05 MED ORDER — HYDROCODONE-ACETAMINOPHEN 5-325 MG PO TABS: ORAL_TABLET | ORAL | 0 refills | Status: AC

## 2024-06-05 NOTE — ED Provider Notes (Signed)
 [place] EMERGENCY DEPARTMENT AT [facility] Provider Note   CSN: [id number] Arrival date & time: 06/05/24  1340     Patient presents with: Hypertension   Maxwell White is a 48 y.o. male.    Hypertension Pertinent negatives include no chest pain, no headaches and no shortness of breath.       Maxwell White is a 48 y.o. male who presents to the Emergency Department for evaluation of high blood pressure.  Has a history of same but states he was taken off of his blood pressure medication after weight loss.  Seen here yesterday for right shoulder pain given anti-inflammatory.  States he took the medication and noticed that his blood pressure was elevated.  He also reports having some nasal congestion since yesterday and is requesting COVID and flu testing.  Denies any chest pain, shortness of breath, fever, headache, visual changes, dizziness or syncope.  Prior to Admission medications   Medication Sig Start Date End Date Taking? Authorizing Provider  Cholecalciferol (VITAMIN D3) 25 MCG (1000 UT) CAPS Take 1 capsule (1,000 Units total) by mouth daily. 01/17/23   Golda Lynwood PARAS, MD  diclofenac  (VOLTAREN ) 75 MG EC tablet Take 1 tablet (75 mg total) by mouth 2 (two) times daily. 06/04/24   Daralene Lonni BIRCH, PA-C  methocarbamol  (ROBAXIN ) 750 MG tablet Take 1 tablet (750 mg total) by mouth 3 (three) times daily. 06/04/24   Daralene Lonni BIRCH, PA-C  nicotine  (NICODERM CQ  - DOSED IN MG/24 HOURS) 14 mg/24hr patch Place 1 patch (14 mg total) onto the skin daily. 01/12/23   Golda Lynwood PARAS, MD  Varenicline  Tartrate, Starter, (CHANTIX  STARTING MONTH PAK) 0.5 MG X 11 & 1 MG X 42 TBPK Days 1 to 3: 0.5 mg once daily. Days 4 to 7: 0.5 mg twice daily. Then 1 mg twice daily 01/12/23   Steen, James J, MD    Allergies: Patient has no known allergies.    Review of Systems  Constitutional:  Negative for appetite change, chills and fever.  HENT:  Positive for congestion.   Eyes:   Negative for visual disturbance.  Respiratory:  Negative for shortness of breath.   Cardiovascular:  Negative for chest pain.  Gastrointestinal:  Negative for nausea and vomiting.  Musculoskeletal:  Negative for neck pain.  Neurological:  Negative for dizziness, syncope, weakness and headaches.    Updated Vital Signs BP (!) 152/109 (BP Location: Right Arm)   Pulse 60   Temp 97.8 F (36.6 C) (Oral)   Resp 19   Ht 6' 3 (1.905 m)   Wt 94 kg   SpO2 100%   BMI 25.90 kg/m   Physical Exam Vitals and nursing note reviewed.  Constitutional:      General: He is not in acute distress.    Appearance: Normal appearance. He is not toxic-appearing.  HENT:     Mouth/Throat:     Mouth: Mucous membranes are moist.  Cardiovascular:     Rate and Rhythm: Normal rate and regular rhythm.     Pulses: Normal pulses.  Pulmonary:     Effort: Pulmonary effort is normal.  Abdominal:     Palpations: Abdomen is soft.     Tenderness: There is no abdominal tenderness.  Musculoskeletal:        General: Tenderness present.     Right shoulder: Tenderness present. No swelling, bony tenderness or crepitus. Normal range of motion. Normal strength. Normal pulse.     Cervical back: Normal range of  motion.     Comments: Some discomfort on ROM of the right shoulder but pt able to fully rotate and abduct the right arm.  No edema or bony deformity of the shoulder   Skin:    General: Skin is warm.  Neurological:     Mental Status: He is alert.     (all labs ordered are listed, but only abnormal results are displayed) Labs Reviewed  RESP PANEL BY RT-PCR (RSV, FLU A&B, COVID)  [id number]  CBC WITH DIFFERENTIAL/PLATELET  BASIC METABOLIC PANEL WITH GFR    EKG: None  Radiology: DG Shoulder Right Result Date: 06/04/2024 CLINICAL DATA:  Right shoulder pain for 1 week. History of rotator cuff injury years ago. EXAM: RIGHT SHOULDER - 2+ VIEW COMPARISON:  Radiographs 03/22/2022 FINDINGS: The mineralization and  alignment are normal. There is no evidence of acute fracture or dislocation. Stable mild acromioclavicular degenerative changes. No significant glenohumeral arthropathy. No focal soft tissue abnormalities are identified. IMPRESSION: No acute osseous findings. Stable mild acromioclavicular degenerative changes. Electronically Signed   By: Elsie Perone M.D.   On: 06/04/2024 10:23     Procedures   Medications Ordered in the ED - No data to display                                  Medical Decision Making Patient here for evaluation of elevated blood pressure.  Seen here yesterday for right shoulder pain and given anti-inflammatory.  He was concerned that anti-inflammatory caused his blood pressure to be elevated.  On review of medical record, patient is noted to have a history of hypertension.  States he was taken off of his antihypertensive medication after significant weight loss.  Denies any associated symptoms.  Not followed up with orthopedics regarding his shoulder  Had shoulder x-ray yesterday without acute bony finding  Hypertensive here with hx of same, not currently on anti hypertensive.  asymptomatic  Amount and/or Complexity of Data Reviewed Labs: ordered.    Details: Labs performed today no evidence of endorgan damage.  COVID flu and RSV ordered at patient's request and were negative Discussion of management or test interpretation with external provider(s): Patient here with elevated blood pressure, no evidence of endorgan damage or hypertensive crisis.  Discussed importance of close outpatient follow-up, and shared decision making with the patient regarding starting back on antihypertensive medications.  Will address his shoulder pain and have him d/c NSAID use.  Elevated pain level maybe contributing to his currently eleved pressure.  He will monitor closely at home.  Will provide local resources for primary care.  He agrees to close f/u.  Return precautions  given  Risk Prescription drug management.        Final diagnoses:  Hypertension, unspecified type  Acute pain of right shoulder    ED Discharge Orders     None          Herlinda Milling, PA-C 06/09/24 9041    Patsey Lot, MD 06/11/24 [phone]

## 2024-06-05 NOTE — Discharge Instructions (Signed)
 Your blood pressure today is elevated.  This may be related to your shoulder pain.  You have been prescribed different medication to take for your shoulder pain.  Please continue to monitor your blood pressure daily and make a log of your daily blood pressures.  I recommend that you contact one of the primary care clinics listed to establish primary care.  Return to the emergency department for any new or worsening symptoms.

## 2024-06-05 NOTE — ED Triage Notes (Signed)
 Pt presents w/ complaints of hypertension. States he was seen yesterday for shoulder pain and the medication we sent him home w/ gave him high blood pressure. Pts pressure is 152/109 in triage.

## 2024-10-03 ENCOUNTER — Emergency Department (HOSPITAL_COMMUNITY)
Admission: EM | Admit: 2024-10-03 | Discharge: 2024-10-03 | Disposition: A | Discharge status: Home / Self Care | Payer: MEDICAID | Attending: Emergency Medicine | Admitting: Emergency Medicine

## 2024-10-03 ENCOUNTER — Other Ambulatory Visit: Payer: Self-pay

## 2024-10-03 ENCOUNTER — Encounter (HOSPITAL_COMMUNITY): Payer: Self-pay

## 2024-10-03 DIAGNOSIS — B349 Viral infection, unspecified: Secondary | ICD-10-CM | POA: Insufficient documentation

## 2024-10-03 DIAGNOSIS — R059 Cough, unspecified: Secondary | ICD-10-CM | POA: Diagnosis present

## 2024-10-03 LAB — RESP PANEL BY RT-PCR (RSV, FLU A&B, COVID)  RVPGX2
Influenza A by PCR: NEGATIVE
Influenza B by PCR: NEGATIVE
Resp Syncytial Virus by PCR: NEGATIVE
SARS Coronavirus 2 by RT PCR: NEGATIVE

## 2024-10-03 NOTE — ED Triage Notes (Signed)
 Pt arrived via POV c/o multiple flu like symptoms since last week and reports he is unable to return to work until he is tested for the flu. Pt reports he has been using OTC medications and getting rest while pushing fluids.

## 2024-10-03 NOTE — ED Provider Notes (Signed)
 " [place] EMERGENCY DEPARTMENT AT [facility] Provider Note   CSN: [id number] Arrival date & time: 10/03/24  1524     Patient presents with: flu like symptoms   Maxwell White is a 49 y.o. male.   Patient is a 49 year old male who presents to the emergency department stating that he needs to be swabbed for flu in order to return to work.  He notes that approximate week ago he developed cough, congestion, generalized malaise and fatigue.  The symptoms have since resolved but his work would not let him return without a negative flu swab.  He notes that he is otherwise asymptomatic at this point with no other acute complaints.        Prior to Admission medications  Medication Sig Start Date End Date Taking? Authorizing Provider  Cholecalciferol (VITAMIN D3) 25 MCG (1000 UT) CAPS Take 1 capsule (1,000 Units total) by mouth daily. 01/17/23   Golda Lynwood PARAS, MD  diclofenac  (VOLTAREN ) 75 MG EC tablet Take 1 tablet (75 mg total) by mouth 2 (two) times daily. 06/04/24   Daralene Lonni BIRCH, PA-C  HYDROcodone -acetaminophen  (NORCO/VICODIN) 5-325 MG tablet Take one tab po q 4 hrs prn pain 06/05/24   Triplett, Tammy, PA-C  methocarbamol  (ROBAXIN ) 750 MG tablet Take 1 tablet (750 mg total) by mouth 3 (three) times daily. 06/04/24   Daralene Lonni BIRCH, PA-C  nicotine  (NICODERM CQ  - DOSED IN MG/24 HOURS) 14 mg/24hr patch Place 1 patch (14 mg total) onto the skin daily. 01/12/23   Golda Lynwood PARAS, MD  Varenicline  Tartrate, Starter, (CHANTIX  STARTING MONTH PAK) 0.5 MG X 11 & 1 MG X 42 TBPK Days 1 to 3: 0.5 mg once daily. Days 4 to 7: 0.5 mg twice daily. Then 1 mg twice daily 01/12/23   Steen, James J, MD    Allergies: Patient has no known allergies.    Review of Systems  All other systems reviewed and are negative.   Updated Vital Signs BP (!) 146/106 (BP Location: Right Arm)   Pulse 64   Temp 99.1 F (37.3 C) (Oral)   Resp 19   Ht 6' 3 (1.905 m)   Wt 94 kg   SpO2 99%   BMI  25.90 kg/m   Physical Exam Vitals and nursing note reviewed.  Constitutional:      Appearance: Normal appearance.  HENT:     Head: Normocephalic and atraumatic.     Nose: Nose normal.     Mouth/Throat:     Mouth: Mucous membranes are moist.  Eyes:     Extraocular Movements: Extraocular movements intact.     Conjunctiva/sclera: Conjunctivae normal.     Pupils: Pupils are equal, round, and reactive to light.  Cardiovascular:     Rate and Rhythm: Normal rate and regular rhythm.     Pulses: Normal pulses.     Heart sounds: Normal heart sounds.  Pulmonary:     Effort: Pulmonary effort is normal.     Breath sounds: Normal breath sounds.  Musculoskeletal:        General: Normal range of motion.     Cervical back: Normal range of motion and neck supple.  Skin:    General: Skin is warm and dry.  Neurological:     General: No focal deficit present.     Mental Status: He is alert and oriented to person, place, and time. Mental status is at baseline.  Psychiatric:        Mood and Affect: Mood  normal.        Behavior: Behavior normal.        Thought Content: Thought content normal.        Judgment: Judgment normal.     (all labs ordered are listed, but only abnormal results are displayed) Labs Reviewed  RESP PANEL BY RT-PCR (RSV, FLU A&B, COVID)  [id number]    EKG: None  Radiology: No results found.   Procedures   Medications Ordered in the ED - No data to display                                  Medical Decision Making Patient is doing well at this time and is stable for discharge home.  Discussed with patient he has a negative for influenza, COVID-19 and RSV.  Do suspect he had an acute viral syndrome which has resolved.  Vital signs are otherwise stable at this point.  No other emergent workup is warranted at this time.  Strict turn precautions were discussed for any new or worsening symptoms.  Patient voiced understanding and had no additional questions.         Final diagnoses:  Acute viral syndrome    ED Discharge Orders     None          Daralene Lonni JONETTA DEVONNA 10/03/24 1714    Francesca Elsie CROME, MD 10/03/24 2202  "

## 2024-10-03 NOTE — ED Notes (Signed)
 ED Provider at bedside.

## 2024-10-03 NOTE — Discharge Instructions (Signed)
 Follow-up with your primary care doctor on an outpatient basis as needed.  Return to emergency department immediately for any new or worsening symptoms.  [facility] Primary Care Doctor List    [name], MD. Specialty: [facility] Medicine Contact information: [address]  [name] [address]  [phone]   [name], MD. Specialty: [facility] Medicine Contact information: [address]  [place] [address]  [phone]   [name], MD Specialty: Internal Medicine Contact information: [address] [place] [address]  [phone]   [name], MD. Specialty: Internal Medicine Contact information: [address]  [place] [address]  [phone]    [name] Specialty: Family Medicine Contact information: [address]  [place] [address]  [phone]   [name], MD. Specialty: [facility] Medicine Contact information: [address]  [address]  [place] [address]  [phone]   [name], MD. Specialty: Internal Medicine Contact information: [address]  [address]  [place] [address]  [phone]   [name] Family Medicine: [address] [phone]  [name], Family medicine [address]  [phone]  [facility] [address] [place], [place] [phone]  [name]: [address] [phone]    [name]  [address] [place], [place] [address] [phone]  Services [facility] offers a variety of basic health services.  Services include but are not limited to: Blood pressure checks  Heart rate checks  Blood sugar checks  Urine analysis  Rapid strep tests  Pregnancy tests.  Health education and referrals  People needing more complex services will be directed to a physician online. Using these virtual visits, doctors can evaluate  and prescribe medicine and treatments. There will be no medication on-site, though [facility] will help patients fill their prescriptions at little to no cost.   For More information please go to: [email or web address]  Allergy and Asthma:    [address] [name] [phone]  Urology:  [address]  [place] [phone]  [facility]  [address] [place], [place] [phone]  Orthopedics   [address] [place], [place] [phone]  Endocrinology  [address] [place], [place] [phone]  Podiatry: [facility] [phone]
